# Patient Record
Sex: Male | Born: 1970 | Race: White | Hispanic: No | Marital: Married | Smoking: Never smoker
Health system: Southern US, Community
[De-identification: ages and names within clinical notes are randomized; demographics above are authoritative.]

## PROBLEM LIST (undated history)

## (undated) DIAGNOSIS — K219 Gastro-esophageal reflux disease without esophagitis: Secondary | ICD-10-CM

## (undated) HISTORY — PX: HERNIA REPAIR: SHX51

## (undated) HISTORY — PX: APPENDECTOMY: SHX54

---

## 2004-03-29 ENCOUNTER — Emergency Department: Payer: Self-pay | Admitting: Emergency Medicine

## 2004-07-30 ENCOUNTER — Emergency Department: Payer: Self-pay | Admitting: Emergency Medicine

## 2004-08-02 ENCOUNTER — Emergency Department: Payer: Self-pay | Admitting: Emergency Medicine

## 2004-08-12 ENCOUNTER — Ambulatory Visit: Payer: Self-pay | Admitting: Internal Medicine

## 2005-09-29 ENCOUNTER — Emergency Department: Payer: Self-pay | Admitting: Emergency Medicine

## 2006-03-18 ENCOUNTER — Emergency Department: Payer: Self-pay | Admitting: Emergency Medicine

## 2006-11-04 ENCOUNTER — Emergency Department: Payer: Self-pay | Admitting: Emergency Medicine

## 2007-02-01 ENCOUNTER — Emergency Department: Payer: Self-pay | Admitting: Emergency Medicine

## 2007-02-10 ENCOUNTER — Emergency Department: Payer: Self-pay | Admitting: Emergency Medicine

## 2007-03-26 ENCOUNTER — Ambulatory Visit: Payer: Self-pay | Admitting: Physician Assistant

## 2007-06-16 ENCOUNTER — Other Ambulatory Visit: Payer: Self-pay

## 2007-06-17 ENCOUNTER — Inpatient Hospital Stay: Payer: Self-pay | Admitting: Internal Medicine

## 2007-07-16 ENCOUNTER — Emergency Department: Payer: Self-pay | Admitting: Emergency Medicine

## 2007-07-20 ENCOUNTER — Emergency Department: Payer: Self-pay | Admitting: Emergency Medicine

## 2007-07-22 ENCOUNTER — Encounter: Payer: Self-pay | Admitting: Specialist

## 2007-07-27 ENCOUNTER — Ambulatory Visit: Payer: Self-pay | Admitting: Anesthesiology

## 2007-08-08 ENCOUNTER — Encounter: Payer: Self-pay | Admitting: Specialist

## 2007-09-08 ENCOUNTER — Encounter: Payer: Self-pay | Admitting: Specialist

## 2007-09-21 ENCOUNTER — Ambulatory Visit: Payer: Self-pay | Admitting: Specialist

## 2008-09-25 ENCOUNTER — Emergency Department: Payer: Self-pay | Admitting: Emergency Medicine

## 2009-03-29 ENCOUNTER — Emergency Department (HOSPITAL_COMMUNITY): Admission: EM | Admit: 2009-03-29 | Discharge: 2009-03-29 | Payer: Self-pay | Admitting: Emergency Medicine

## 2010-04-26 ENCOUNTER — Inpatient Hospital Stay: Payer: Self-pay | Admitting: Internal Medicine

## 2010-11-21 ENCOUNTER — Emergency Department: Payer: Self-pay | Admitting: Emergency Medicine

## 2010-11-22 ENCOUNTER — Emergency Department: Payer: Self-pay | Admitting: Emergency Medicine

## 2010-11-26 ENCOUNTER — Emergency Department: Payer: Self-pay | Admitting: Emergency Medicine

## 2010-12-05 ENCOUNTER — Emergency Department (HOSPITAL_COMMUNITY): Payer: BC Managed Care – PPO

## 2010-12-05 ENCOUNTER — Emergency Department (HOSPITAL_COMMUNITY)
Admission: EM | Admit: 2010-12-05 | Discharge: 2010-12-06 | Disposition: A | Discharge status: Home / Self Care | Payer: BC Managed Care – PPO | Attending: Emergency Medicine | Admitting: Emergency Medicine

## 2010-12-05 DIAGNOSIS — M545 Low back pain, unspecified: Secondary | ICD-10-CM | POA: Insufficient documentation

## 2010-12-05 DIAGNOSIS — M542 Cervicalgia: Secondary | ICD-10-CM | POA: Insufficient documentation

## 2010-12-05 DIAGNOSIS — IMO0002 Reserved for concepts with insufficient information to code with codable children: Secondary | ICD-10-CM | POA: Insufficient documentation

## 2010-12-05 DIAGNOSIS — S139XXA Sprain of joints and ligaments of unspecified parts of neck, initial encounter: Secondary | ICD-10-CM | POA: Insufficient documentation

## 2010-12-05 DIAGNOSIS — W108XXA Fall (on) (from) other stairs and steps, initial encounter: Secondary | ICD-10-CM | POA: Insufficient documentation

## 2010-12-05 DIAGNOSIS — R51 Headache: Secondary | ICD-10-CM | POA: Insufficient documentation

## 2010-12-05 LAB — CBC
Hemoglobin: 14.7 g/dL (ref 13.0–17.0)
MCHC: 34.8 g/dL (ref 30.0–36.0)
Platelets: 222 10*3/uL (ref 150–400)

## 2010-12-05 LAB — COMPREHENSIVE METABOLIC PANEL
ALT: 63 U/L — ABNORMAL HIGH (ref 0–53)
AST: 18 U/L (ref 0–37)
Albumin: 3.7 g/dL (ref 3.5–5.2)
Alkaline Phosphatase: 68 U/L (ref 39–117)
BUN: 19 mg/dL (ref 6–23)
CO2: 28 mEq/L (ref 19–32)
Calcium: 7.8 mg/dL — ABNORMAL LOW (ref 8.4–10.5)
Chloride: 103 mEq/L (ref 96–112)
Creatinine, Ser: 0.83 mg/dL (ref 0.50–1.35)
GFR calc Af Amer: >60 mL/min (ref >60)
GFR calc non Af Amer: >60 mL/min (ref >60)
Glucose, Bld: 83 mg/dL (ref 70–99)
Total Protein: 6.1 g/dL (ref 6.0–8.3)

## 2010-12-05 LAB — LACTIC ACID, PLASMA: Lactic Acid, Venous: 0.6 mmol/L (ref 0.5–2.2)

## 2010-12-05 LAB — PROTIME-INR: INR: 1.03 (ref 0.00–1.49)

## 2010-12-05 MED ORDER — IOHEXOL 300 MG/ML  SOLN
100.0000 mL | Freq: Once | INTRAMUSCULAR | Status: AC | PRN
  Administered 2010-12-05: 100 mL via INTRAVENOUS

## 2011-03-10 ENCOUNTER — Emergency Department: Payer: Self-pay

## 2011-07-21 ENCOUNTER — Emergency Department: Payer: Self-pay | Admitting: Emergency Medicine

## 2011-08-28 ENCOUNTER — Emergency Department: Payer: Self-pay | Admitting: *Deleted

## 2011-08-28 LAB — CBC
Platelet: 208 10*3/uL (ref 150–440)
RBC: 4.78 10*6/uL (ref 4.40–5.90)
RDW: 15 % — ABNORMAL HIGH (ref 11.5–14.5)
WBC: 12.8 10*3/uL — ABNORMAL HIGH (ref 3.8–10.6)

## 2011-08-28 LAB — CK TOTAL AND CKMB (NOT AT ARMC)
CK, Total: 54 U/L (ref 35–232)
CK-MB: 0.9 ng/mL (ref 0.5–3.6)

## 2011-08-28 LAB — COMPREHENSIVE METABOLIC PANEL
Anion Gap: 7 (ref 7–16)
BUN: 17 mg/dL (ref 7–18)
Bilirubin,Total: 0.4 mg/dL (ref 0.2–1.0)
Chloride: 106 mmol/L (ref 98–107)
Co2: 27 mmol/L (ref 21–32)
Creatinine: 0.88 mg/dL (ref 0.60–1.30)
EGFR (African American): >60
Osmolality: 281 (ref 275–301)
Potassium: 3.8 mmol/L (ref 3.5–5.1)
Sodium: 140 mmol/L (ref 136–145)
Total Protein: 6.9 g/dL (ref 6.4–8.2)

## 2011-08-28 LAB — TROPONIN I: Troponin-I: <0.02 ng/mL

## 2012-03-04 ENCOUNTER — Encounter (HOSPITAL_COMMUNITY): Payer: Self-pay | Admitting: Emergency Medicine

## 2012-03-04 ENCOUNTER — Emergency Department (HOSPITAL_COMMUNITY)
Admission: EM | Admit: 2012-03-04 | Discharge: 2012-03-04 | Disposition: A | Discharge status: Home / Self Care | Payer: Self-pay | Attending: Emergency Medicine | Admitting: Emergency Medicine

## 2012-03-04 DIAGNOSIS — R200 Anesthesia of skin: Secondary | ICD-10-CM

## 2012-03-04 DIAGNOSIS — M79609 Pain in unspecified limb: Secondary | ICD-10-CM | POA: Insufficient documentation

## 2012-03-04 DIAGNOSIS — M546 Pain in thoracic spine: Secondary | ICD-10-CM

## 2012-03-04 DIAGNOSIS — R209 Unspecified disturbances of skin sensation: Secondary | ICD-10-CM | POA: Insufficient documentation

## 2012-03-04 MED ORDER — PREDNISONE 10 MG PO TABS: 20.0000 mg | ORAL_TABLET | Freq: Two times a day (BID) | ORAL | Status: DC

## 2012-03-04 MED ORDER — OXYCODONE-ACETAMINOPHEN 5-325 MG PO TABS: 1.0000 | ORAL_TABLET | Freq: Four times a day (QID) | ORAL | Status: DC | PRN

## 2012-03-04 NOTE — ED Provider Notes (Signed)
History     CSN: [id number]  Arrival date & time 03/04/12  0848   First MD Initiated Contact with Patient 03/04/12 0901      Chief Complaint  Patient presents with  . Back Pain    (Consider location/radiation/quality/duration/timing/severity/associated sxs/prior treatment) HPI Comments: Patient with a history of chronic low back pain.  Started one month ago with pain in the upper back and now reports numbness in both hands.  He has a pcp that he says he cannot afford to see as he has no insurance.  There are no bowel or bladder complaints.  Patient is a 41 y.o. male presenting with back pain. The history is provided by the patient.  Back Pain  This is a new problem. Episode onset: one month. The problem occurs constantly. The problem has been gradually worsening. The pain is associated with no known injury. The pain is present in the thoracic spine. The quality of the pain is described as shooting. Radiates to: right arm and leg. The pain is severe. The symptoms are aggravated by bending. Pertinent negatives include no fever. He has tried NSAIDs and analgesics for the symptoms. The treatment provided no relief.    History reviewed. No pertinent past medical history.  Past Surgical History  Procedure Date  . Hernia repair     No family history on file.  History  Substance Use Topics  . Smoking status: Never Smoker   . Smokeless tobacco: Not on file  . Alcohol Use: No      Review of Systems  Constitutional: Negative for fever.  Musculoskeletal: Positive for back pain.  All other systems reviewed and are negative.    Allergies  Hydrocodone  Home Medications   Current Outpatient Rx  Name Route Sig Dispense Refill  . ALPRAZOLAM 2 MG PO TABS Oral Take 2 mg by mouth 3 (three) times daily.    . IBUPROFEN 600 MG PO TABS Oral Take 600 mg by mouth every 6 (six) hours as needed. For pain    . OXYCODONE-ACETAMINOPHEN 5-325 MG PO TABS Oral Take 1-2 tablets by mouth every 4  (four) hours as needed. For pain      BP 119/80  Pulse 70  Temp 98.6 F (37 C) (Oral)  Resp 18  SpO2 97%  Physical Exam  Nursing note and vitals reviewed. Constitutional: He is oriented to person, place, and time. He appears well-developed and well-nourished. No distress.  HENT:  Head: Normocephalic and atraumatic.  Neck: Normal range of motion. Neck supple.  Cardiovascular: Normal rate and regular rhythm.   Pulmonary/Chest: Effort normal and breath sounds normal.  Abdominal: Soft. Bowel sounds are normal.  Musculoskeletal:       There is ttp in the soft tissues of the upper thoracic region.    Neurological: He is alert and oriented to person, place, and time.       The dtr's are 1+ and equal in the bue's.  Strength is 5/5 in the bue.    Skin: Skin is warm and dry. He is not diaphoretic.    ED Course  Procedures (including critical care time)  Labs Reviewed - No data to display No results found.   No diagnosis found.    MDM  The patient presents with bilateral arm numbness and upper back pain without injury or trauma.  He is from [place] and has a pcp there who treats him for his chronic back pain.  He took his last percocet today.  His exam is  without neurological deficit and although he wants an mri today, I see no emergent need for this.  He will be treated with prednisone, percocet, and he needs to see his pcp if this meds aren't helping and to determine when imaging is indicated.          Geoffery Lyons, MD 03/04/12 [phone]

## 2012-03-04 NOTE — ED Notes (Signed)
Pt c/o back pain radiating to shoulders and hands, denies trauma, ambulatory to dep, states he took his last oxycodone pta, NAD

## 2012-03-25 ENCOUNTER — Inpatient Hospital Stay: Payer: Self-pay | Admitting: Surgery

## 2012-03-25 LAB — URINALYSIS, COMPLETE
Bacteria: NONE SEEN
Glucose,UR: NEGATIVE mg/dL (ref 0–75)
Ketone: NEGATIVE
Nitrite: NEGATIVE
Specific Gravity: 1.018 (ref 1.003–1.030)
WBC UR: 1 /HPF (ref 0–5)

## 2012-03-25 LAB — CBC
HGB: 15.1 g/dL (ref 13.0–18.0)
MCV: 90 fL (ref 80–100)
Platelet: 231 10*3/uL (ref 150–440)
RBC: 4.92 10*6/uL (ref 4.40–5.90)
RDW: 14.1 % (ref 11.5–14.5)

## 2012-03-25 LAB — BASIC METABOLIC PANEL
Anion Gap: 11 (ref 7–16)
Calcium, Total: 8.9 mg/dL (ref 8.5–10.1)
Chloride: 106 mmol/L (ref 98–107)
Osmolality: 284 (ref 275–301)
Potassium: 4.2 mmol/L (ref 3.5–5.1)

## 2012-04-21 IMAGING — CT CT ABD-PELV W/ CM
4 of 5 series · 13 of 46 positions shown, 19 images · IV contrast (100ml omni 300)
Comparison: None.

CT CHEST

CLINICAL DATA: Fall on back from 6 feet.  Severe low back pain.

CT CHEST, ABDOMEN AND PELVIS WITH CONTRAST
TECHNIQUE: Multidetector CT imaging of the chest, abdomen and
pelvis was performed following the standard protocol during bolus
administration of intravenous contrast.
Contrast: 100 ml Omnipaque 300 IV.

[Series 2: chest/abd/pelvis · axial · 0.89mm/px · z∈[-592,-202]mm · 6 of 134 slices shown]
[im 16/134  soft-tissue]
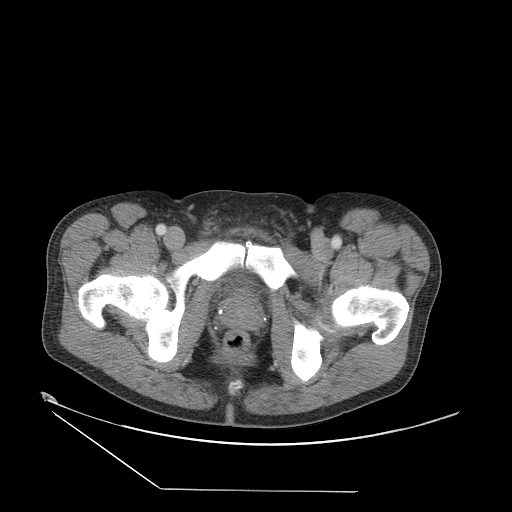
[im 32/134  soft-tissue]
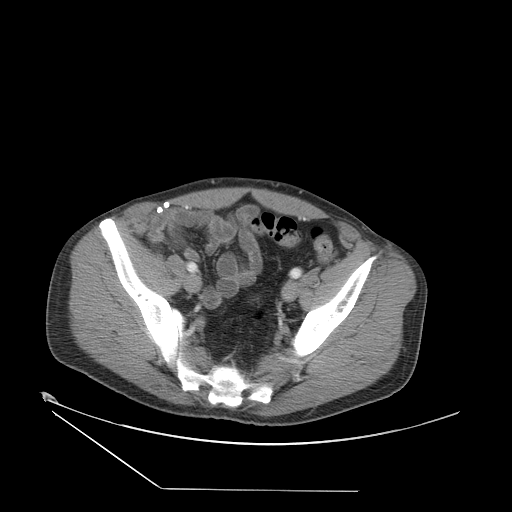
[im 47/134  soft-tissue]
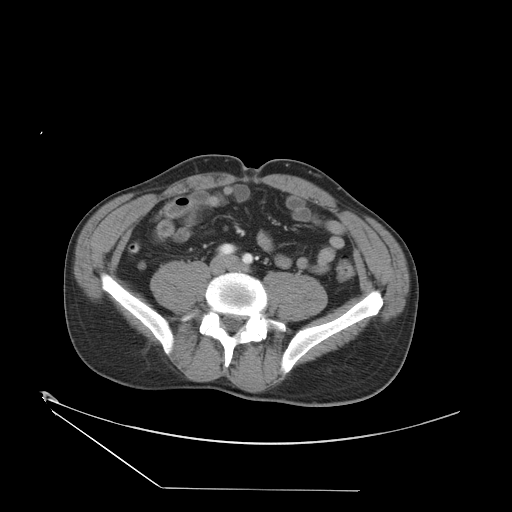
[im 63/134  soft-tissue]
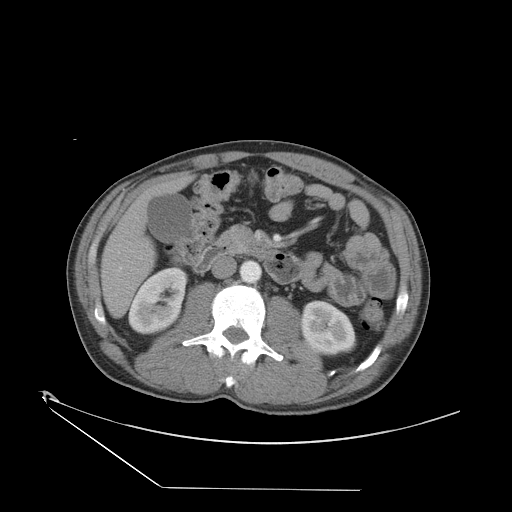
[im 79/134  soft-tissue]
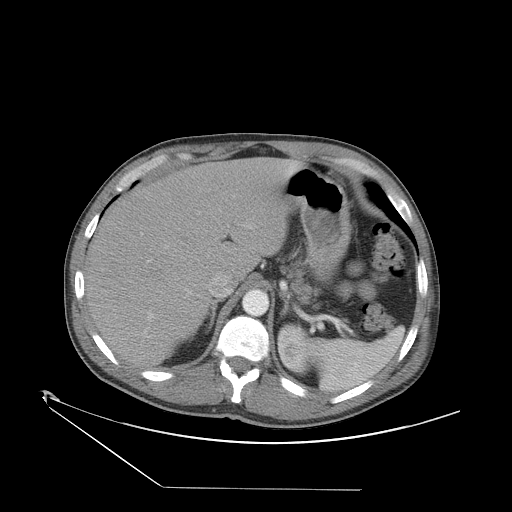
[im 94/134  soft-tissue]
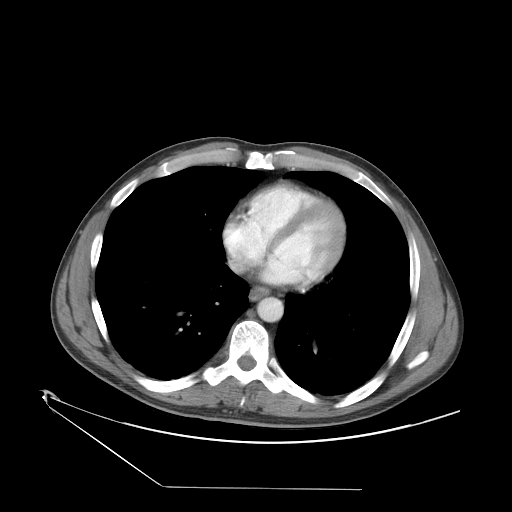

[Series 5: renal delays · axial · 0.92mm/px · z∈[-373,-293]mm · 3 of 34 slices shown, 7 images]
[im 9/34  soft-tissue]
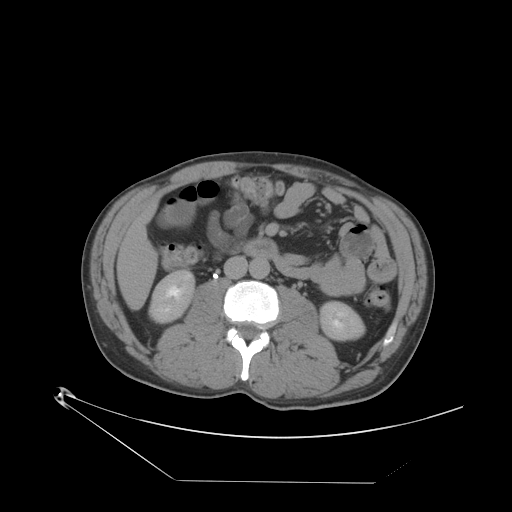
[im 9/34  lung]
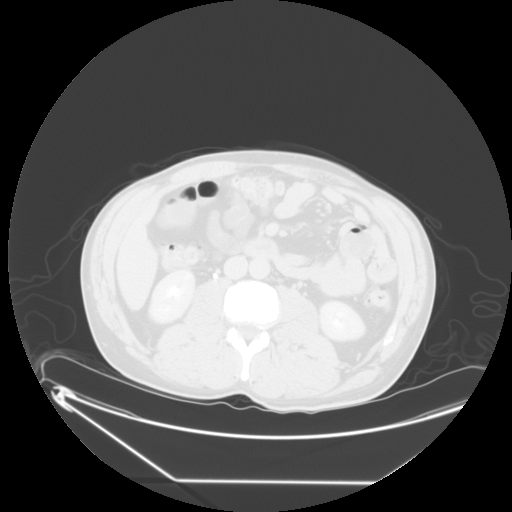
[im 9/34  bone]
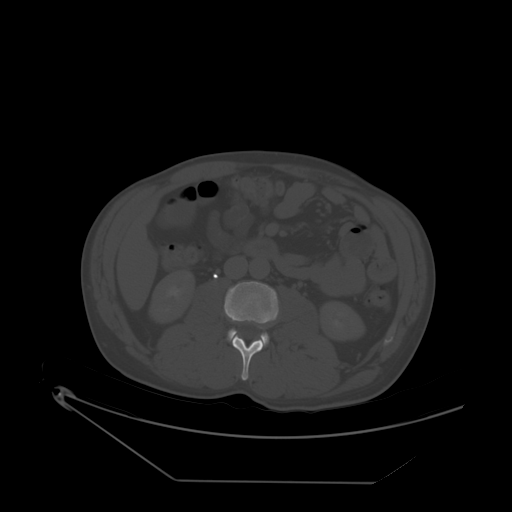
[im 17/34  soft-tissue]
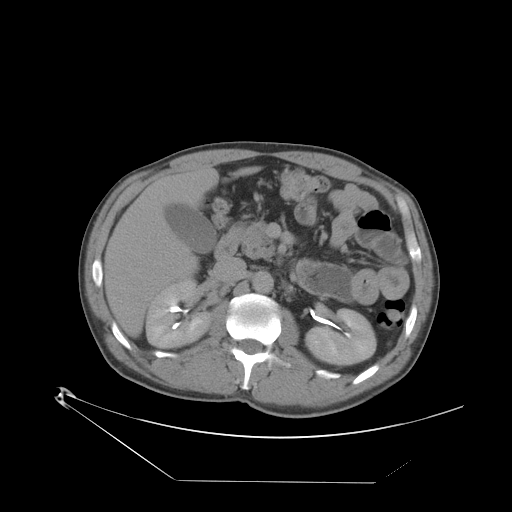
[im 17/34  lung]
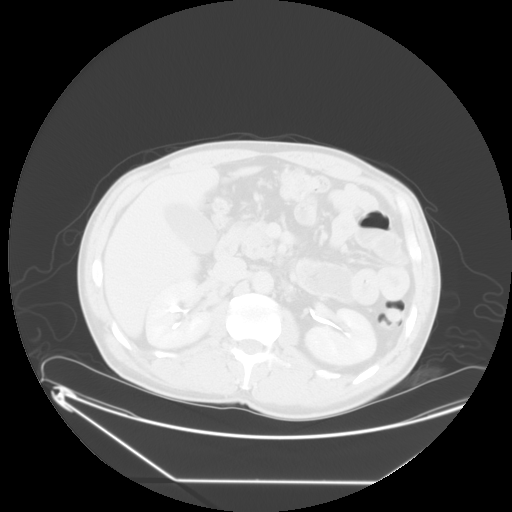
[im 25/34  soft-tissue]
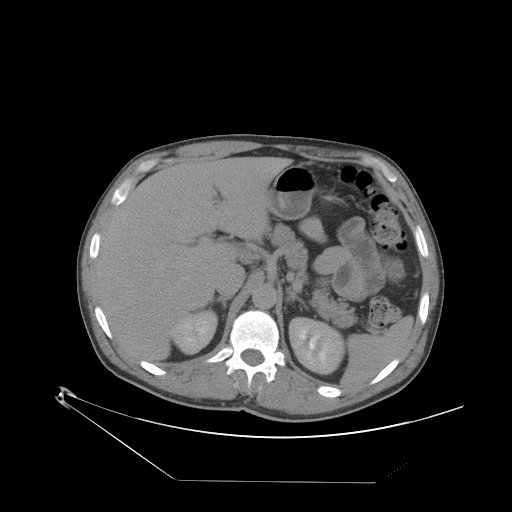
[im 25/34  lung]
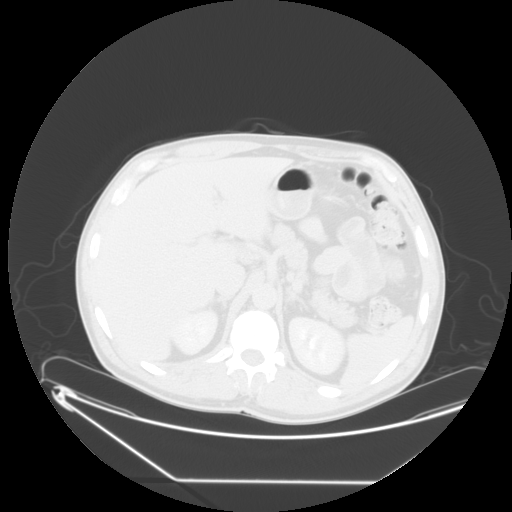

[Series 400: sag · sagittal · 1.35mm/px · 1 of 130 slices shown, 2 images]
[im 44/130  soft-tissue]
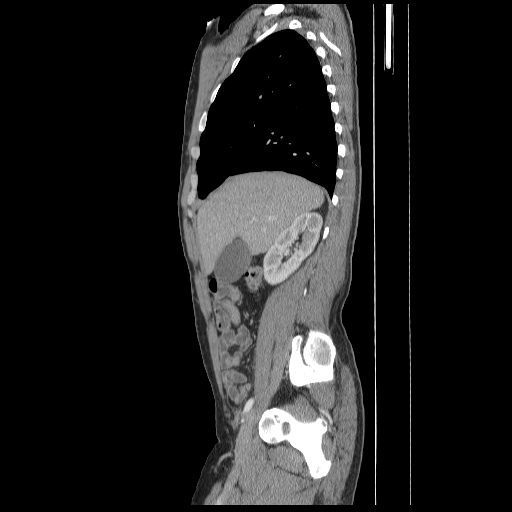
[im 44/130  bone]
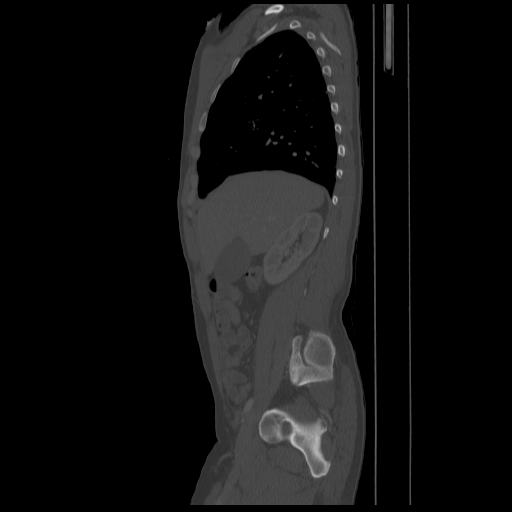

[Series 401: cor · coronal · 1.35mm/px · 3 of 107 slices shown, 4 images]
[im 36/107  soft-tissue]
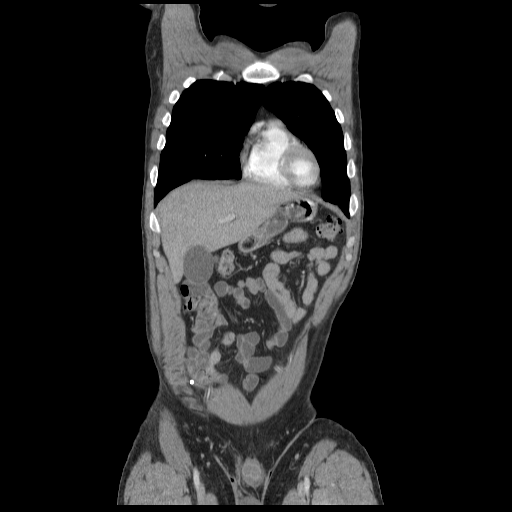
[im 48/107  soft-tissue]
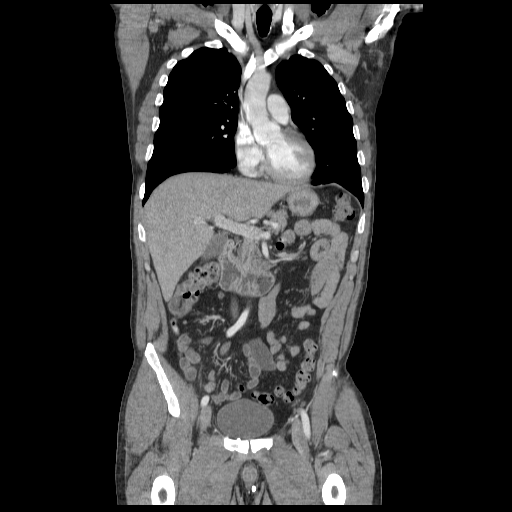
[im 48/107  bone]
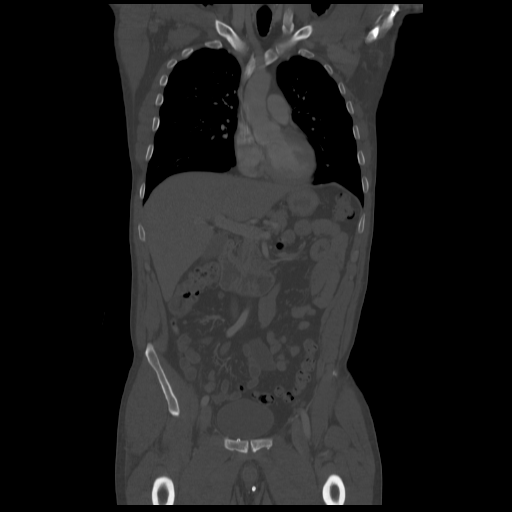
[im 59/107  soft-tissue]
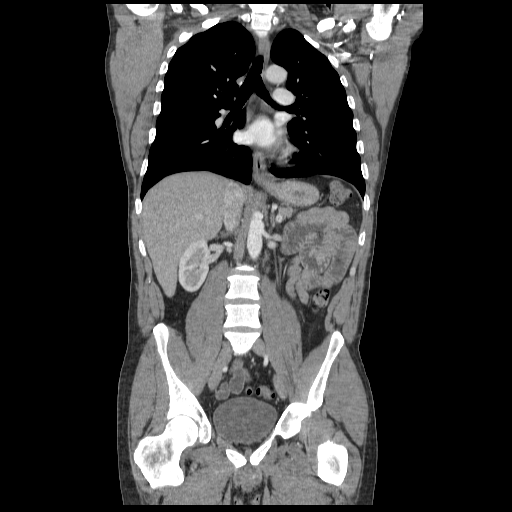

[13 of 46 positions shown; findings below may reference images not displayed]

FINDINGS: Minimal dependent atelectasis in the lungs.  Lungs
otherwise clear.  No pleural effusions or pneumothorax.

Heart is normal size. Aorta is normal caliber. No mediastinal,
hilar, or axillary adenopathy.  Visualized thyroid and chest wall
soft tissues unremarkable.

No acute bony abnormality.
IMPRESSION: No acute findings in the chest.

CT ABDOMEN AND PELVIS
FINDINGS: No acute lumbar spine abnormality.  Normal alignment.
No fracture visualized.  Bony pelvis is intact.

Liver, gallbladder, spleen, pancreas, adrenals and kidneys are
normal. Bowel grossly unremarkable.  No free fluid, free air, or
adenopathy.  Appendix is visualized and is normal.  Urinary bladder
unremarkable.
IMPRESSION: No acute findings in the abdomen or pelvis.

## 2012-06-11 ENCOUNTER — Ambulatory Visit: Payer: Self-pay | Admitting: Surgery

## 2012-06-15 ENCOUNTER — Ambulatory Visit: Payer: Self-pay | Admitting: Surgery

## 2012-06-15 LAB — CBC WITH DIFFERENTIAL/PLATELET
Eosinophil #: 0.1 10*3/uL (ref 0.0–0.7)
HCT: 43.9 % (ref 40.0–52.0)
HGB: 15 g/dL (ref 13.0–18.0)
Lymphocyte %: 31.2 %
MCH: 29.9 pg (ref 26.0–34.0)
MCHC: 34.2 g/dL (ref 32.0–36.0)
MCV: 87 fL (ref 80–100)
Monocyte #: 1 x10 3/mm (ref 0.2–1.0)
Monocyte %: 10.5 %
Neutrophil %: 57.2 %
RBC: 5.03 10*6/uL (ref 4.40–5.90)
RDW: 14.9 % — ABNORMAL HIGH (ref 11.5–14.5)

## 2012-06-15 LAB — COMPREHENSIVE METABOLIC PANEL
Albumin: 4.3 g/dL (ref 3.4–5.0)
Bilirubin,Total: 0.4 mg/dL (ref 0.2–1.0)
Calcium, Total: 8.7 mg/dL (ref 8.5–10.1)
Chloride: 107 mmol/L (ref 98–107)
Co2: 25 mmol/L (ref 21–32)
Creatinine: 0.99 mg/dL (ref 0.60–1.30)
EGFR (African American): >60
EGFR (Non-African Amer.): >60
Osmolality: 280 (ref 275–301)
Potassium: 3.7 mmol/L (ref 3.5–5.1)
SGOT(AST): 28 U/L (ref 15–37)
Total Protein: 7.6 g/dL (ref 6.4–8.2)

## 2012-06-17 ENCOUNTER — Ambulatory Visit: Payer: Self-pay | Admitting: Surgery

## 2012-08-01 ENCOUNTER — Emergency Department: Payer: Self-pay | Admitting: Internal Medicine

## 2012-09-27 ENCOUNTER — Emergency Department: Payer: Self-pay | Admitting: Emergency Medicine

## 2012-09-27 LAB — CBC WITH DIFFERENTIAL/PLATELET
Eosinophil %: 0.1 %
HGB: 14.1 g/dL (ref 13.0–18.0)
Lymphocyte #: 2.3 10*3/uL (ref 1.0–3.6)
MCH: 30.1 pg (ref 26.0–34.0)
MCV: 88 fL (ref 80–100)
Monocyte #: 0.8 x10 3/mm (ref 0.2–1.0)
Neutrophil %: 70.6 %
Platelet: 274 10*3/uL (ref 150–440)
RDW: 14.4 % (ref 11.5–14.5)
WBC: 11 10*3/uL — ABNORMAL HIGH (ref 3.8–10.6)

## 2012-09-27 LAB — COMPREHENSIVE METABOLIC PANEL
Albumin: 3.8 g/dL (ref 3.4–5.0)
Alkaline Phosphatase: 88 U/L (ref 50–136)
BUN: 9 mg/dL (ref 7–18)
Co2: 24 mmol/L (ref 21–32)
Creatinine: 0.86 mg/dL (ref 0.60–1.30)
Glucose: 99 mg/dL (ref 65–99)
Osmolality: 280 (ref 275–301)
Potassium: 3.8 mmol/L (ref 3.5–5.1)
SGPT (ALT): 44 U/L (ref 12–78)
Sodium: 141 mmol/L (ref 136–145)

## 2012-09-28 LAB — TROPONIN I: Troponin-I: <0.02 ng/mL

## 2013-01-18 ENCOUNTER — Emergency Department: Payer: Self-pay | Admitting: Emergency Medicine

## 2013-01-18 LAB — DRUG SCREEN, URINE
Amphetamines, Ur Screen: NEGATIVE (ref ?–1000)
Benzodiazepine, Ur Scrn: POSITIVE (ref ?–200)
Cannabinoid 50 Ng, Ur ~~LOC~~: POSITIVE (ref ?–50)
Cocaine Metabolite,Ur ~~LOC~~: NEGATIVE (ref ?–300)
Tricyclic, Ur Screen: NEGATIVE (ref ?–1000)

## 2013-01-18 LAB — CBC
HGB: 14.3 g/dL (ref 13.0–18.0)
MCH: 30 pg (ref 26.0–34.0)
MCHC: 34.9 g/dL (ref 32.0–36.0)
Platelet: 279 10*3/uL (ref 150–440)
RBC: 4.75 10*6/uL (ref 4.40–5.90)
RDW: 14.7 % — ABNORMAL HIGH (ref 11.5–14.5)

## 2013-01-18 LAB — BASIC METABOLIC PANEL
Anion Gap: 8 (ref 7–16)
Creatinine: 0.86 mg/dL (ref 0.60–1.30)
EGFR (African American): >60
EGFR (Non-African Amer.): >60
Glucose: 95 mg/dL (ref 65–99)
Potassium: 3.9 mmol/L (ref 3.5–5.1)
Sodium: 143 mmol/L (ref 136–145)

## 2013-01-18 LAB — ETHANOL
Ethanol %: 0.062 %
Ethanol: 62 mg/dL

## 2013-01-18 LAB — LIPASE, BLOOD: Lipase: 112 U/L (ref 73–393)

## 2013-01-18 LAB — TROPONIN I
Troponin-I: <0.02 ng/mL
Troponin-I: <0.02 ng/mL

## 2013-01-18 LAB — HEPATIC FUNCTION PANEL A (ARMC)
Alkaline Phosphatase: 96 U/L (ref 50–136)
Bilirubin, Direct: <0.1 mg/dL (ref 0.00–0.20)

## 2013-01-26 ENCOUNTER — Emergency Department: Payer: Self-pay | Admitting: General Practice

## 2013-02-09 ENCOUNTER — Emergency Department: Payer: Self-pay | Admitting: Emergency Medicine

## 2013-02-09 LAB — COMPREHENSIVE METABOLIC PANEL
Albumin: 3.7 g/dL (ref 3.4–5.0)
Alkaline Phosphatase: 115 U/L (ref 50–136)
Anion Gap: 6 — ABNORMAL LOW (ref 7–16)
Bilirubin,Total: 0.2 mg/dL (ref 0.2–1.0)
Calcium, Total: 9.1 mg/dL (ref 8.5–10.1)
Chloride: 106 mmol/L (ref 98–107)
Creatinine: 1.05 mg/dL (ref 0.60–1.30)
EGFR (African American): >60
Glucose: 100 mg/dL — ABNORMAL HIGH (ref 65–99)
Potassium: 3.9 mmol/L (ref 3.5–5.1)
SGOT(AST): 18 U/L (ref 15–37)

## 2013-02-09 LAB — URINALYSIS, COMPLETE
Blood: NEGATIVE
Ketone: NEGATIVE
Leukocyte Esterase: NEGATIVE
Nitrite: NEGATIVE
Ph: 8 (ref 4.5–8.0)
RBC,UR: <1 /HPF (ref 0–5)
Specific Gravity: 1.016 (ref 1.003–1.030)
Squamous Epithelial: <1
WBC UR: 1 /HPF (ref 0–5)

## 2013-02-09 LAB — TSH: Thyroid Stimulating Horm: 2.63 u[IU]/mL

## 2013-02-09 LAB — DRUG SCREEN, URINE
Amphetamines, Ur Screen: NEGATIVE (ref ?–1000)
Barbiturates, Ur Screen: NEGATIVE (ref ?–200)
Benzodiazepine, Ur Scrn: POSITIVE (ref ?–200)
Cannabinoid 50 Ng, Ur ~~LOC~~: POSITIVE (ref ?–50)
Cocaine Metabolite,Ur ~~LOC~~: NEGATIVE (ref ?–300)
MDMA (Ecstasy)Ur Screen: NEGATIVE (ref ?–500)
Methadone, Ur Screen: NEGATIVE (ref ?–300)

## 2013-02-09 LAB — CBC
HCT: 45.2 % (ref 40.0–52.0)
HGB: 15.4 g/dL (ref 13.0–18.0)
MCH: 29.6 pg (ref 26.0–34.0)
Platelet: 282 10*3/uL (ref 150–440)
RDW: 15.2 % — ABNORMAL HIGH (ref 11.5–14.5)

## 2013-06-29 ENCOUNTER — Emergency Department: Payer: Self-pay | Admitting: Emergency Medicine

## 2013-06-29 LAB — URINALYSIS, COMPLETE
Bacteria: NONE SEEN
Bilirubin,UR: NEGATIVE
Blood: NEGATIVE
Glucose,UR: NEGATIVE mg/dL (ref 0–75)
KETONE: NEGATIVE
Leukocyte Esterase: NEGATIVE
NITRITE: NEGATIVE
PROTEIN: NEGATIVE
Ph: 6 (ref 4.5–8.0)
RBC,UR: NONE SEEN /HPF (ref 0–5)
Specific Gravity: 1.039 (ref 1.003–1.030)
Squamous Epithelial: NONE SEEN
WBC UR: 2 /HPF (ref 0–5)

## 2013-06-29 LAB — COMPREHENSIVE METABOLIC PANEL
ALK PHOS: 99 U/L
ALT: 33 U/L (ref 12–78)
ANION GAP: 6 — AB (ref 7–16)
Albumin: 3.8 g/dL (ref 3.4–5.0)
BUN: 14 mg/dL (ref 7–18)
Bilirubin,Total: 0.3 mg/dL (ref 0.2–1.0)
CHLORIDE: 107 mmol/L (ref 98–107)
Calcium, Total: 9.1 mg/dL (ref 8.5–10.1)
Co2: 23 mmol/L (ref 21–32)
Creatinine: 0.81 mg/dL (ref 0.60–1.30)
EGFR (African American): >60
EGFR (Non-African Amer.): >60
Glucose: 108 mg/dL — ABNORMAL HIGH (ref 65–99)
Osmolality: 273 (ref 275–301)
Potassium: 3.9 mmol/L (ref 3.5–5.1)
SGOT(AST): 22 U/L (ref 15–37)
SODIUM: 136 mmol/L (ref 136–145)
Total Protein: 7 g/dL (ref 6.4–8.2)

## 2013-06-29 LAB — CBC
HCT: 44.2 % (ref 40.0–52.0)
HGB: 14.7 g/dL (ref 13.0–18.0)
MCH: 28.4 pg (ref 26.0–34.0)
MCHC: 33.2 g/dL (ref 32.0–36.0)
MCV: 86 fL (ref 80–100)
Platelet: 251 10*3/uL (ref 150–440)
RBC: 5.16 10*6/uL (ref 4.40–5.90)
RDW: 15.5 % — ABNORMAL HIGH (ref 11.5–14.5)
WBC: 10 10*3/uL (ref 3.8–10.6)

## 2014-07-19 ENCOUNTER — Emergency Department: Payer: Self-pay | Admitting: Emergency Medicine

## 2014-09-26 NOTE — Op Note (Signed)
PATIENT NAME:  Victor Logan, Victor Logan DATE OF BIRTH:  Nov 04, 1970  DATE OF PROCEDURE:  03/25/2012  PREOPERATIVE DIAGNOSIS: Acute appendicitis.   POSTOPERATIVE DIAGNOSIS: Acute appendicitis.   PROCEDURE PERFORMED: Laparoscopic appendectomy.   SURGEON: Ida Roguehristopher Daisia Slomski, MD  ESTIMATED BLOOD LOSS: 20 mL.   COMPLICATIONS: None.   INDICATION FOR SURGERY: Victor Logan is a pleasant 44 year old male with one day of right lower quadrant pain. He had a CT scan concerning for acute appendicitis. We brought him to the operating room for laparoscopic appendectomy.   DETAILS OF PROCEDURE: Victor Logan was brought to the operating room suite. He was laid supine on the operating room table. He was induced, endotracheal tube was placed, and general anesthesia was administered. His abdomen was then prepped and draped in standard surgical fashion. A time out was then performed correctly identifying the patient's name, operative site, and procedure to be performed. He had an umbilical hernia which was previously repaired and I was concerned that with his previous, what he said was a mesh repair, that I would injure adherent bowel so therefore I made a midline incision inferior to the umbilicus. I deepened through to the fascia. The fascia was grasped. The fascia was incised. The peritoneum was entered. Two 0 Vicryl stay sutures were placed and a Hassan trocar was placed in the abdomen. The abdomen was insufflated. The appendix was not immediately visualized. Two 5 mm trocars, one in the left lower quadrant and one in the suprapubic region, were then used. The appendix was grasped. It was a bit more cephalad in the abdomen than McBurney's point, corresponding to his point of maximal tenderness. It was noted to be adherent to the adjacent mesentery. I made a hole into the mesoappendix at the base of the appendix. I then placed an Echelon endoscopic stapler with a blue load across the base of the appendix and then  ligated the base of the appendix flush with the cecum. I then used a combination of hook cautery and staple loads to take the long mesoappendix. Of note, I did tear the appendix in half in my attempts to ligate the mesoappendix and it came out in two pieces. After the appendix was taken out through an Endo Catch bag, I then examined the staple lines. There was no obvious bleeding. I then irrigated the abdomen and then all the trocars were taken out under direct visualization. I then closed the infraumbilical fascia with an 0 Vicryl figure-of-eight suture and then the skin was closed on all incisions with a 4-0 Monocryl inverted deep dermal suture. Steri-Strips, Telfa, gauze, and Tegaderm were then used to complete the dressing. He was then awoken, extubated, and taken to the postanesthesia care unit. There were no immediate complications. Needle, sponge, and instrument counts were correct at the end of the procedure.  ____________________________ Si Raiderhristopher A. Keeleigh Terris, MD cal:slb D: 03/25/2012 16:34:01 ET T: 03/25/2012 16:56:48 ET JOB#: [id number]  cc: Cristal Deerhristopher A. Angelis Gates, MD, <Dictator> Jarvis NewcomerHRISTOPHER A Tajuan Dufault MD ELECTRONICALLY SIGNED 03/28/2012 10:10

## 2014-09-26 NOTE — H&P (Signed)
  Subjective/Chief Complaint RLQ pain, nausea, fever to 101    History of Present Illness Victor Logan is a pleasant 44 yo M with a history of hernia repair and peptic ulcer disease who presents with 1 day of RLQ pain.  He says that he actually had bilateral back pain initially radiating to his flank but now has new onset RLQ pain.  Since this pain began he has had nausea and vomiting.  Last meal yesterday.  Had 1 temp to 101.  Pain getting worse.  Has not migrated.  Having regular BM.  Back pain has improved.  Has never felt this pain before.    Past History Peptic ulcer disease Hernia repair - 2002 [place] Depression Back pain   Past Med/Surgical Hx:  Depression:   Herniated Disc:   Degenerative Disc Disease:   gerd:   back injury:   Chronic back pain:   Hernia Repair:   ALLERGIES:  Hydrocodone: N/V/Diarrhea  HOME MEDICATIONS: Medication Instructions Status  acetaminophen-oxyCODONE 325 mg-5 mg tablet 1 tab(s) orally every 6 hours, As Needed- for Pain  Active  alprazolam 2 mg oral tablet 1 tab(s) orally 3 to 4 times a day, As Needed for anxiety Active  cyclobenzaprine 10 mg oral tablet 1 tab(s) orally 3 to 4 times a day, As Needed for spasms Active     Medications Ibuprofen prn back pain   Family and Social History:   Family History Coronary Artery Disease  Diabetes Mellitus    Social History negative tobacco, positive ETOH, positive Illicit drugs, MJ use 1 week ago    Place of Living Home   Review of Systems:   Subjective/Chief Complaint RLQ pain, nausea/vomiting, fever    Fever/Chills Yes    Cough No    Sputum No    Abdominal Pain Yes    Diarrhea No    Constipation No    Nausea/Vomiting Yes    SOB/DOE No    Chest Pain No    Telemetry Reviewed NSR    Dysuria No    Tolerating PT Yes  No    Tolerating Diet No    Medications/Allergies Reviewed Medications/Allergies reviewed   Physical Exam:   GEN well developed, no acute distress    HEENT pink  conjunctivae, PERRL    NECK supple    RESP normal resp effort  clear BS  no use of accessory muscles    CARD regular rate  irregular rate  no thrills    ABD positive tenderness  denies Flank Tenderness  no liver/spleen enlargement  positive hernia  soft  no Adominal Mass    LYMPH negative neck    EXTR negative cyanosis/clubbing, negative edema    SKIN No rashes, No ulcers    NEURO cranial nerves deficit, negative rigidity, negative tremor, follows commands    PSYCH A+O to time, place, person, good insight   Lab Results: Routine Chem:  17-Oct-13 09:15    Glucose, Serum  108   BUN 14   Creatinine (comp) 0.80   Sodium, Serum 142   Potassium, Serum 4.2   Chloride, Serum 106   CO2, Serum 25   Calcium (Total), Serum 8.9   Anion Gap 11   Osmolality (calc) 284   eGFR (African American) >60   eGFR (Non-African American) >60 (eGFR values <60mL/min/1.73 m2 may be an indication of chronic kidney disease (CKD). Calculated eGFR is useful in patients with stable renal function. The eGFR calculation will not be reliable in acutely ill patients when serum   creatinine is changing rapidly. It is not useful in  patients on dialysis. The eGFR calculation may not be applicable to patients at the low and high extremes of body sizes, pregnant women, and vegetarians.)  Routine UA:  17-Oct-13 09:15    Color (UA) Yellow   Clarity (UA) Clear   Glucose (UA) Negative   Bilirubin (UA) Negative   Ketones (UA) Negative   Specific Gravity (UA) 1.018   Blood (UA) Negative   pH (UA) 5.0   Protein (UA) Negative   Nitrite (UA) Negative   Leukocyte Esterase (UA) Negative (Result(s) reported on 25 Mar 2012 at 11:26AM.)   RBC (UA) <1 /HPF   WBC (UA) 1 /HPF   Bacteria (UA) NONE SEEN   Epithelial Cells (UA) NONE SEEN   Mucous (UA) PRESENT (Result(s) reported on 25 Mar 2012 at 11:26AM.)  Routine Hem:  17-Oct-13 09:15    WBC (CBC) 8.3   RBC (CBC) 4.92   Hemoglobin (CBC) 15.1   Hematocrit (CBC)  44.2   Platelet Count (CBC) 231 (Result(s) reported on 25 Mar 2012 at 09:29AM.)   MCV 90   MCH 30.6   MCHC 34.1   RDW 14.1     Assessment/Admission Diagnosis 44 yo M with history of RLQ pain, nausea/vomiting, fevers.  Nl wbc but appendix does appear thickened with adjacent stranding on CT scan.    Plan Plan for OR for lap appendectomy   Electronic Signatures: ,  A (MD)  (Signed 17-Oct-13 12:50)  Authored: CHIEF COMPLAINT and HISTORY, PAST MEDICAL/SURGIAL HISTORY, ALLERGIES, HOME MEDICATIONS, OTHER MEDICATIONS, FAMILY AND SOCIAL HISTORY, REVIEW OF SYSTEMS, PHYSICAL EXAM, LABS, ASSESSMENT AND PLAN   Last Updated: 17-Oct-13 12:50 by ,  A (MD) 

## 2014-09-29 NOTE — Consult Note (Signed)
PATIENT NAME:  Victor Logan, Victor Logan MR#:  [id number] DATE OF BIRTH:  02-09-1971  DATE OF CONSULTATION:  02/10/2013  REFERRING PHYSICIAN:  Lowella FairyJohn Woodruff, MD CONSULTING PHYSICIAN:  Ardeen FillersUzma S. Garnetta BuddyFaheem, MD  REASON FOR CONSULTATION:  "I need help".   HISTORY OF PRESENT ILLNESS:  The patient is a 44 year old married Caucasian male who presented to the ED accompanied by his wife as he was having suicidal thoughts in the context of using drugs. The patient reported that he has been having depression for a long period of time and finally he decided to seek help. He reported that he was feeling very emotional and does not want to be around anyone at this time. He reported that he has been struggling with drug use as well as binge drinking off and on for the past two years. He reported that his drug of choice is cocaine and he spends approximately $300 per month. He reported that he also drinks alcohol to the point that he blacks out. The patient reported that he consumes beer as well as some hard liquor. He used the last time over the weekend. The patient reported that he has a history of withdrawal symptoms including feeling sick, shaky as well as sweating. The patient reported that he was controlling the withdrawal symptoms with the help of the Xanax, 2 mg 4 times a day, which are prescribed by his primary care physician, Dr. Lacie ScottsNiemeyer. He ran out of the Xanax over the weekend. The patient reported that he is also prescribed pain medications including tramadol and Flexeril by Dr. Lacie ScottsNiemeyer due to a history of back pain. The patient stated that he wants help, as he does not know what is the main reason that he is having these symptoms, and he has been using drugs to control his anxiety, depression, and has been self-medicating himself. He stated that he occasionally hears voices calling him, but they are not clear and coherent. However, he is not having any commanding auditory hallucinations. He currently denied having any  suicidal ideations or plans and is interested in going to the rehab program.   PAST PSYCHIATRIC HISTORY:  The patient reported that he does not have any history of psychiatric admission in the past. He has never attempted suicide but has thought about it multiple times in the past. He reported that he is currently fighting intoxication and has been having rage from anxiety. He also is concerned about the voices, which comes and goes, but they are not clear cut. He reported that the current medications do not help him.   CURRENT MEDICATIONS:  Citalopram 20 mg daily, tramadol 50 mg every 4 to 6 hours p.r.n., Flexeril for back pain.   He reported that he was admitted for the detox when he was 44 years old for only three days. He has never been to a rehab program.   FAMILY HISTORY:  He reported that his father's side of the family has history of drug use. The patient denied any history of suicide in his family.   PAST MEDICAL HISTORY:  Chronic back pain, GERD.   ALLERGIES:  HYDROCODONE.   SOCIAL HISTORY:  The patient reported that he is currently married and lives with his wife. They are currently undergoing separation. He does not have any children. He has two DWIs, which are currently pending, but he reported that his probation officer is going help him if he will go to the rehab programs.   ANCILLARY DATA:  Temperature 98.5, pulse 78, respirations  20, blood pressure 118/74.   LABS:  Glucose 100, BUN 9, creatinine 1.05, sodium 139, potassium 3.9, chloride 106, bicarbonate 27, anion gap 6. Blood alcohol level less than 3, bilirubin 0.2, AST 18, ALT 24. UDS positive for benzodiazepines, cannabinoids and tricyclic antidepressants. WBC 9.7, hemoglobin 15.4, MCV 87.  REVIEW OF SYSTEMS:  GENERAL:  Denies having any weight loss.  EYES:  No double or blurred vision.  GASTROINTESTINAL:  No abdominal pain, nausea, vomiting, diarrhea.  GENITOURINARY:  No incontinence.  MUSCULOSKELETAL:  Denies any muscle  or joint pain.  NEUROLOGIC:  No tingling or weakness.   MENTAL STATUS EXAMINATION:  The patient is a moderately built male who was lying in the bed. He appeared calm and collective. His mood was anxious. Affect was congruent. Thought process was logical and goal-directed. Thought content was nondelusional. He appeared somewhat depressed. His affect was blunted. Thought process was logical and goal-directed. Thought content was nondelusional. He thought about coming into the rehab program and when he was giving information, he said he will think about it. He demonstrated poor insight and judgment regarding his drug use.     DIAGNOSTIC IMPRESSION:  AXIS I:  1.  Mood disorder, not otherwise specified.                2.  Alcohol dependence.                 3.  Cocaine dependence.  AXIS II:  None.  AXIS III:  Please review the medical history.   TREATMENT PLAN:  I discussed the patient at length about the different options available for rehab including [facility] and a bed has been available for tomorrow, but he reported that he will think about it. He reported that he wants to continue his medications including Xanax, citalopram at this time. I advised the patient that he might not get the same medications and he will go to the rehab facility and he demonstrated understanding. I will start him on Librium 25 mg p.o. q.6 hours to prevent him from going into withdrawal from the Xanax. He will be provided information about the rehabilitation facility, and the patient will be monitored closely for any withdrawal symptoms. Thank you for allowing me to participate in the care of this patient.   ____________________________ Ardeen Fillers. Garnetta Buddy, MD usf:jm D: 02/10/2013 14:14:54 ET Logan: 02/10/2013 14:27:29 ET JOB#: [id number]  cc: Ardeen Fillers. Garnetta Buddy, MD, <Dictator> Rhunette Croft MD ELECTRONICALLY SIGNED 02/17/2013 13:23

## 2014-09-29 NOTE — Op Note (Signed)
PATIENT NAME:  Victor Logan, [name] MR#:  [id number] DATE OF BIRTH:  1971-02-27  DATE OF PROCEDURE:  06/17/2012  SURGEON:  Cristal Deerhristopher A. Rik Wadel, MD.  ASSISTANT:  Rudolpho Sevinaroline York, [place] student.  PREOPERATIVE DIAGNOSIS:  Recurrent incarcerated umbilical hernia.   POSTOPERATIVE DIAGNOSIS:  Recurrent incarcerated umbilical hernia.  PROCEDURE PERFORMED: Open umbilical hernia repair with 4 x 4 cm piece Ventralex patch.   ANESTHESIA: General.   ESTIMATED BLOOD LOSS:  10 mL.  SPECIMENS: None.   INDICATIONS FOR SURGERY: Mr. Neil CrouchWinn is a pleasant 44 year old male who presented with recurrent umbilical hernia, after having a previous mesh repair.  He thus was brought to the Operating Room for umbilical hernia repair.   DETAILS OF PROCEDURE: Mr. Neil CrouchWinn was brought to the Operating Room suite. He was induced, endotracheal tube was placed, general anesthesia was administered. The abdomen was then prepped and draped in standard surgical sterile fashion. A supraumbilical incision was made.  The incision was deepened down to the fascia.  The umbilicus was taken off the fascial defect.  There was an approximately 0.8 x 0.8 cm defect. Because this had recurred previously, although I was not able to see mesh or previous sutures. I elected to place a 4 x 4 Ventralex patch. I cleared off the inferior aspect of the defect and then placed the Ventralex patch. I then closed the defect longitudinally with interrupted figure-of-eight 0-Ethibond sutures, incorporating the mesh tails into the bites.  I then irrigated the incision.  The umbilicus was then tacked to the repair using inverted 3-0 Vicryl sutures. The subcutaneous dead space was then closed using 3-0 Vicryl interrupted figure-of-eight and the skin was closed using 4-0 Monocryl running subcuticular. SteriStrips and Telfa gauze and Tegaderm was then placed over the wound. Approximately 15 mL 1% lidocaine mixed with 0.5% Marcaine with epinephrine were used for local  anesthetic.  The patient was then awoken, extubated and brought to the post-anesthesia care unit.  There were no immediate complications. Needle, sponge, and instrument counts were correct at the end of the procedure.   ____________________________ Si Raiderhristopher A. Jathen Sudano, MD cal:eg D: 06/17/2012 15:18:32 ET T: 06/17/2012 19:29:47 ET JOB#: [id number]  cc: Cristal Deerhristopher A. Corretta Munce, MD, <Dictator> Jarvis NewcomerHRISTOPHER A Tasean Mancha MD ELECTRONICALLY SIGNED 06/18/2012 11:23

## 2014-10-03 ENCOUNTER — Emergency Department
Admit: 2014-10-03 | Disposition: A | Discharge status: Home / Self Care | Payer: Self-pay | Admitting: Emergency Medicine

## 2014-10-04 LAB — URINALYSIS, COMPLETE
BACTERIA: NONE SEEN
Bilirubin,UR: NEGATIVE
GLUCOSE, UR: NEGATIVE mg/dL (ref 0–75)
Ketone: NEGATIVE
LEUKOCYTE ESTERASE: NEGATIVE
Nitrite: NEGATIVE
Ph: 5 (ref 4.5–8.0)
Protein: NEGATIVE
Specific Gravity: 1.021 (ref 1.003–1.030)

## 2014-12-06 ENCOUNTER — Encounter: Payer: Self-pay | Admitting: Emergency Medicine

## 2014-12-06 ENCOUNTER — Emergency Department
Admission: EM | Admit: 2014-12-06 | Discharge: 2014-12-06 | Disposition: A | Discharge status: Home / Self Care | Payer: Self-pay | Attending: Emergency Medicine | Admitting: Emergency Medicine

## 2014-12-06 DIAGNOSIS — Z87828 Personal history of other (healed) physical injury and trauma: Secondary | ICD-10-CM | POA: Insufficient documentation

## 2014-12-06 DIAGNOSIS — M25511 Pain in right shoulder: Secondary | ICD-10-CM | POA: Insufficient documentation

## 2014-12-06 DIAGNOSIS — Z7952 Long term (current) use of systemic steroids: Secondary | ICD-10-CM | POA: Insufficient documentation

## 2014-12-06 DIAGNOSIS — Z79899 Other long term (current) drug therapy: Secondary | ICD-10-CM | POA: Insufficient documentation

## 2014-12-06 MED ORDER — OXYCODONE-ACETAMINOPHEN 5-325 MG PO TABS
2.0000 | ORAL_TABLET | Freq: Once | ORAL | Status: AC
  Administered 2014-12-06: 2 via ORAL

## 2014-12-06 MED ORDER — OXYCODONE-ACETAMINOPHEN 5-325 MG PO TABS
ORAL_TABLET | ORAL | Status: AC
Start: 2014-12-06 — End: 2014-12-06
  Filled 2014-12-06: qty 2

## 2014-12-06 MED ORDER — DEXAMETHASONE SODIUM PHOSPHATE 10 MG/ML IJ SOLN
INTRAMUSCULAR | Status: AC
  Filled 2014-12-06: qty 1

## 2014-12-06 MED ORDER — OXYCODONE-ACETAMINOPHEN 5-325 MG PO TABS: 1.0000 | ORAL_TABLET | Freq: Three times a day (TID) | ORAL | Status: DC | PRN

## 2014-12-06 MED ORDER — DEXAMETHASONE SODIUM PHOSPHATE 10 MG/ML IJ SOLN
10.0000 mg | Freq: Once | INTRAMUSCULAR | Status: AC
  Administered 2014-12-06: 10 mg via INTRAMUSCULAR
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
## 2014-12-06 NOTE — ED Provider Notes (Signed)
[facility] Emergency Department Provider Note  ____________________________________________  Time seen: [id number]  I have reviewed the triage vital signs and the nursing notes.   HISTORY  Chief Complaint Shoulder Pain    HPI Ihor DowWilliam T Sandora is a 44 y.o. male complaining of right shoulder pain from physical therapy has a torn labrum and some other issues for which she is having surgery on July 13 using physical therapy yesterday and strained something in her shoulder since become inflamed and incredibly painful for the patient rates it as approximately a 10 out of 10 burning pain which nothing seems to make him better or worse and is here today for further evaluation and treatment   No past medical history on file.  There are no active problems to display for this patient.   Past Surgical History  Procedure Laterality Date  . Hernia repair      Current Outpatient Rx  Name  Route  Sig  Dispense  Refill  . alprazolam (XANAX) 2 MG tablet   Oral   Take 2 mg by mouth 3 (three) times daily.         Marland Kitchen. ibuprofen (ADVIL,MOTRIN) 600 MG tablet   Oral   Take 600 mg by mouth every 6 (six) hours as needed. For pain         . oxyCODONE-acetaminophen (PERCOCET/ROXICET) 5-325 MG per tablet   Oral   Take 1-2 tablets by mouth every 4 (four) hours as needed. For pain         . oxyCODONE-acetaminophen (PERCOCET/ROXICET) 5-325 MG per tablet   Oral   Take 1-2 tablets by mouth every 6 (six) hours as needed for pain.   20 tablet   0   . oxyCODONE-acetaminophen (ROXICET) 5-325 MG per tablet   Oral   Take 1 tablet by mouth every 8 (eight) hours as needed for moderate pain or severe pain.   15 tablet   0   . predniSONE (DELTASONE) 10 MG tablet   Oral   Take 2 tablets (20 mg total) by mouth 2 (two) times daily.   20 tablet   0     Allergies Hydrocodone  History reviewed. No pertinent family history.  Social History History  Substance Use Topics  .  Smoking status: Never Smoker   . Smokeless tobacco: Not on file  . Alcohol Use: No    Review of Systems Constitutional: No fever/chills Eyes: No visual changes. ENT: No sore throat. Cardiovascular: Denies chest pain. Respiratory: Denies shortness of breath. Gastrointestinal: No abdominal pain.  No nausea, no vomiting.  No diarrhea.  No constipation. Genitourinary: Negative for dysuria. Musculoskeletal: Negative for back pain. Skin: Negative for rash. Neurological: Negative for headaches, focal weakness or numbness. 10-point ROS otherwise negative.  ____________________________________________   PHYSICAL EXAM:  VITAL SIGNS: ED Triage Vitals  Enc Vitals Group     BP 12/06/14 0727 150/97 mmHg     Pulse Rate 12/06/14 0727 76     Resp 12/06/14 0727 18     Temp 12/06/14 0727 98.3 F (36.8 C)     Temp Source 12/06/14 0727 Oral     SpO2 12/06/14 0727 96 %     Weight --      Height --      Head Cir --      Peak Flow --      Pain Score 12/06/14 0729 10     Pain Loc --      Pain Edu? --  Excl. in GC? --     Constitutional: Alert and oriented. Well appearing and in no acute distress. Eyes: Conjunctivae are normal. PERRL. EOMI. Head: Atraumatic. Nose: No congestion/rhinnorhea. Mouth/Throat: Mucous membranes are moist.  Oropharynx non-erythematous. Neck: No stridor.   Cardiovascular: Normal rate, regular rhythm. Grossly normal heart sounds.  Good peripheral circulation. Respiratory: Normal respiratory effort.  No retractions. Lungs CTAB. Musculoskeletal: No lower extremity tenderness nor edema.  Pain with palpation to his right shoulder particularly at the ac joint pain with abduction of the arm Neurologic:  Normal speech and language. No gross focal neurologic deficits are appreciated. Speech is normal. No gait instability. Skin:  Skin is warm, dry and intact. No rash noted. Psychiatric: Mood and affect are normal. Speech and behavior are  normal.  ____________________________________________  PROCEDURES  Procedure(s) performed: None  Critical Care performed: No  ____________________________________________   INITIAL IMPRESSION / ASSESSMENT AND PLAN / ED COURSE  Pertinent labs & imaging results that were available during my care of the patient were reviewed by me and considered in my medical decision making (see chart for details).  Initial impression on this patient is right shoulder strain given his history or go ahead and give him some medicine for pain management follow-up with orthopedics as soon as possible return if any acute concerns or worsening symptoms given shot of Decadron and Percocet in the department ____________________________________________   FINAL CLINICAL IMPRESSION(S) / ED DIAGNOSES  Final diagnoses:  Shoulder pain, acute, right     Buzz Axel WAINO MOUNSEY, PA-C 12/06/14 5284  Sharman Cheek, MD 12/06/14 [phone]

## 2014-12-08 HISTORY — PX: SHOULDER SURGERY: SHX246

## 2014-12-14 ENCOUNTER — Encounter: Payer: Self-pay | Admitting: Emergency Medicine

## 2014-12-14 ENCOUNTER — Emergency Department
Admission: EM | Admit: 2014-12-14 | Discharge: 2014-12-14 | Disposition: A | Discharge status: Home / Self Care | Payer: Self-pay | Attending: Emergency Medicine | Admitting: Emergency Medicine

## 2014-12-14 DIAGNOSIS — Y9289 Other specified places as the place of occurrence of the external cause: Secondary | ICD-10-CM | POA: Insufficient documentation

## 2014-12-14 DIAGNOSIS — X19XXXA Contact with other heat and hot substances, initial encounter: Secondary | ICD-10-CM | POA: Insufficient documentation

## 2014-12-14 DIAGNOSIS — T22231A Burn of second degree of right upper arm, initial encounter: Secondary | ICD-10-CM | POA: Insufficient documentation

## 2014-12-14 DIAGNOSIS — M25511 Pain in right shoulder: Secondary | ICD-10-CM

## 2014-12-14 DIAGNOSIS — Y998 Other external cause status: Secondary | ICD-10-CM | POA: Insufficient documentation

## 2014-12-14 DIAGNOSIS — T22211A Burn of second degree of right forearm, initial encounter: Secondary | ICD-10-CM | POA: Insufficient documentation

## 2014-12-14 DIAGNOSIS — Y9389 Activity, other specified: Secondary | ICD-10-CM | POA: Insufficient documentation

## 2014-12-14 DIAGNOSIS — T2220XA Burn of second degree of shoulder and upper limb, except wrist and hand, unspecified site, initial encounter: Secondary | ICD-10-CM

## 2014-12-14 DIAGNOSIS — R52 Pain, unspecified: Secondary | ICD-10-CM

## 2014-12-14 DIAGNOSIS — S41001A Unspecified open wound of right shoulder, initial encounter: Secondary | ICD-10-CM | POA: Insufficient documentation

## 2014-12-14 MED ORDER — OXYCODONE-ACETAMINOPHEN 5-325 MG PO TABS: 1.0000 | ORAL_TABLET | ORAL | Status: DC | PRN

## 2014-12-14 MED ORDER — DICLOFENAC SODIUM 75 MG PO TBEC: 75.0000 mg | DELAYED_RELEASE_TABLET | Freq: Two times a day (BID) | ORAL | Status: DC
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
## 2014-12-14 NOTE — ED Notes (Signed)
States he has a known tear in right shoulder . States pain became increased after PT on tuesday

## 2014-12-14 NOTE — Discharge Instructions (Signed)
CALL YOUR [facility] FOR FURTHER INSTRUCTIONS ABOUT YOUR PAIN CONTROL OR SEE YOUR PRIMARY CARE DOCTOR A LIMITED AMOUNT WAS WRITTEN  FOR YOU TODAY

## 2015-02-01 ENCOUNTER — Emergency Department
Admission: EM | Admit: 2015-02-01 | Discharge: 2015-02-01 | Disposition: A | Discharge status: Home / Self Care | Payer: Self-pay | Attending: Emergency Medicine | Admitting: Emergency Medicine

## 2015-02-01 ENCOUNTER — Encounter: Payer: Self-pay | Admitting: Emergency Medicine

## 2015-02-01 DIAGNOSIS — Z9889 Other specified postprocedural states: Secondary | ICD-10-CM | POA: Insufficient documentation

## 2015-02-01 DIAGNOSIS — G8918 Other acute postprocedural pain: Secondary | ICD-10-CM | POA: Insufficient documentation

## 2015-02-01 DIAGNOSIS — Z791 Long term (current) use of non-steroidal anti-inflammatories (NSAID): Secondary | ICD-10-CM | POA: Insufficient documentation

## 2015-02-01 DIAGNOSIS — Z79899 Other long term (current) drug therapy: Secondary | ICD-10-CM | POA: Insufficient documentation

## 2015-02-01 DIAGNOSIS — M25511 Pain in right shoulder: Secondary | ICD-10-CM | POA: Insufficient documentation

## 2015-02-01 MED ORDER — HYDROMORPHONE HCL 1 MG/ML IJ SOLN
1.0000 mg | Freq: Once | INTRAMUSCULAR | Status: AC
  Administered 2015-02-01: 1 mg via INTRAMUSCULAR
  Filled 2015-02-01: qty 1

## 2015-02-01 NOTE — ED Notes (Signed)
Had surgery at [facility] for bicep and rotator cuff about 6 weeks ago.  He is going to physical therapy, and his pain has increased over past few days since his last PT and he had diff sleeping last night.

## 2015-02-01 NOTE — ED Provider Notes (Signed)
[facility] Emergency Department Provider Note  ____________________________________________  Time seen: Approximately 9:55 AM  I have reviewed the triage vital signs and the nursing notes.   HISTORY  Chief Complaint Shoulder Pain   HPI Victor Logan is a 44 y.o. male patient is here with complaint of right shoulder pain. Patient had rotator cuff biceps surgery [place] 6 weeks ago at [facility]. He is now going through physical therapy and has increased pain for the last several days. He was unable to sleep last night. He is going to see his surgeon at [facility] at 2:00 today but is unable to tolerate the pain. His last Percocet was approximately 7:00 today. Currently his pain is 9 out of 10.   History reviewed. No pertinent past medical history.  There are no active problems to display for this patient.   Past Surgical History  Procedure Laterality Date  . Hernia repair    . Shoulder surgery Right 12/2014    Current Outpatient Rx  Name  Route  Sig  Dispense  Refill  . alprazolam (XANAX) 2 MG tablet   Oral   Take 2 mg by mouth 3 (three) times daily.         . diclofenac (VOLTAREN) 75 MG EC tablet   Oral   Take 1 tablet (75 mg total) by mouth 2 (two) times daily.   30 tablet   0   . ibuprofen (ADVIL,MOTRIN) 600 MG tablet   Oral   Take 600 mg by mouth every 6 (six) hours as needed. For pain         . oxyCODONE-acetaminophen (PERCOCET) 5-325 MG per tablet   Oral   Take 1 tablet by mouth every 4 (four) hours as needed for severe pain.   20 tablet   0   . pregabalin (LYRICA) 150 MG capsule   Oral   Take 150 mg by mouth 2 (two) times daily.           Allergies Hydrocodone  No family history on file.  Social History Social History  Substance Use Topics  . Smoking status: Never Smoker   . Smokeless tobacco: None  . Alcohol Use: Yes     Comment: occassional    Review of Systems Constitutional: No fever/chills Cardiovascular: Denies  chest pain. Respiratory: Denies shortness of breath. Gastrointestinal: No abdominal pain.  No nausea, no vomiting. Genitourinary: Negative for dysuria. Musculoskeletal: Negative for back pain. Skin: Negative for rash. Neurological: Negative for headaches, focal weakness or numbness.  10-point ROS otherwise negative.  ____________________________________________   PHYSICAL EXAM:  VITAL SIGNS: ED Triage Vitals  Enc Vitals Group     BP 02/01/15 0903 135/91 mmHg     Pulse Rate 02/01/15 0903 84     Resp 02/01/15 0903 16     Temp 02/01/15 0903 98.7 F (37.1 C)     Temp Source 02/01/15 0903 Oral     SpO2 02/01/15 0903 96 %     Weight 02/01/15 0903 230 lb (104.327 kg)     Height 02/01/15 0903  (1.778 m)     Head Cir --      Peak Flow --      Pain Score 02/01/15 0904 9     Pain Loc --      Pain Edu? --      Excl. in GC? --     Constitutional: Alert and oriented. Well appearing and in no acute distress. Eyes: Conjunctivae are normal. PERRL. EOMI. Head: Atraumatic.  Nose: No congestion/rhinnorhea. Neck: No stridor.   Cardiovascular: Normal rate, regular rhythm. Grossly normal heart sounds.  Good peripheral circulation. Respiratory: Normal respiratory effort.  No retractions. Lungs CTAB. Gastrointestinal: Soft and nontender. No distention. Musculoskeletal: No lower extremity tenderness nor edema.  No joint effusions. Neurologic:  Normal speech and language. No gross focal neurologic deficits are appreciated. No gait instability. Skin:  Skin is warm, dry and intact. No rash noted. Psychiatric: Mood and affect are normal. Speech and behavior are normal.  ____________________________________________   LABS (all labs ordered are listed, but only abnormal results are displayed)  Labs Reviewed - No data to display PROCEDURES  Procedure(s) performed: None  Critical Care performed: No  ____________________________________________   INITIAL IMPRESSION / ASSESSMENT AND  PLAN / ED COURSE  Pertinent labs & imaging results that were available during my care of the patient were reviewed by me and considered in my medical decision making (see chart for details).  Patient is to keep his appointment at 2:00 today with his doctor at [facility]. No prescription was given as he got a prescription for oxycodone 5 mg #90 on 8/11 ____________________________________________   FINAL CLINICAL IMPRESSION(S) / ED DIAGNOSES  Final diagnoses:  Pain, joint, shoulder region, right  Status post rotator cuff surgery      Tommi Rumps, PA-C 02/01/15 1126  Jene Every, MD 02/01/15 [phone]

## 2015-04-29 ENCOUNTER — Emergency Department: Payer: Self-pay

## 2015-04-29 ENCOUNTER — Emergency Department
Admission: EM | Admit: 2015-04-29 | Discharge: 2015-04-29 | Disposition: A | Discharge status: Home / Self Care | Payer: Self-pay | Attending: Emergency Medicine | Admitting: Emergency Medicine

## 2015-04-29 DIAGNOSIS — M5416 Radiculopathy, lumbar region: Secondary | ICD-10-CM | POA: Insufficient documentation

## 2015-04-29 DIAGNOSIS — M549 Dorsalgia, unspecified: Secondary | ICD-10-CM

## 2015-04-29 DIAGNOSIS — Z79899 Other long term (current) drug therapy: Secondary | ICD-10-CM | POA: Insufficient documentation

## 2015-04-29 DIAGNOSIS — Z791 Long term (current) use of non-steroidal anti-inflammatories (NSAID): Secondary | ICD-10-CM | POA: Insufficient documentation

## 2015-04-29 DIAGNOSIS — R200 Anesthesia of skin: Secondary | ICD-10-CM | POA: Insufficient documentation

## 2015-04-29 MED ORDER — OXYCODONE-ACETAMINOPHEN 5-325 MG PO TABS
ORAL_TABLET | ORAL | Status: AC
  Administered 2015-04-29: 1 via ORAL
  Filled 2015-04-29: qty 1

## 2015-04-29 MED ORDER — CARISOPRODOL 350 MG PO TABS: 350.0000 mg | ORAL_TABLET | Freq: Three times a day (TID) | ORAL | Status: DC | PRN

## 2015-04-29 MED ORDER — OXYCODONE-ACETAMINOPHEN 5-325 MG PO TABS: 1.0000 | ORAL_TABLET | Freq: Four times a day (QID) | ORAL | Status: DC | PRN

## 2015-04-29 MED ORDER — METHYLPREDNISOLONE 4 MG PO TABS: 4.0000 mg | ORAL_TABLET | Freq: Every day | ORAL | Status: DC

## 2015-04-29 MED ORDER — PREDNISONE 20 MG PO TABS
ORAL_TABLET | ORAL | Status: AC
  Administered 2015-04-29: 60 mg via ORAL
  Filled 2015-04-29: qty 3

## 2015-04-29 MED ORDER — DIAZEPAM 5 MG PO TABS
ORAL_TABLET | ORAL | Status: AC
  Administered 2015-04-29: 5 mg via ORAL
  Filled 2015-04-29: qty 1

## 2015-04-29 MED ORDER — OXYCODONE-ACETAMINOPHEN 5-325 MG PO TABS
1.0000 | ORAL_TABLET | Freq: Once | ORAL | Status: AC
  Administered 2015-04-29: 1 via ORAL

## 2015-04-29 MED ORDER — DIAZEPAM 5 MG PO TABS
5.0000 mg | ORAL_TABLET | Freq: Once | ORAL | Status: AC
  Administered 2015-04-29: 5 mg via ORAL

## 2015-04-29 MED ORDER — PREDNISONE 20 MG PO TABS
60.0000 mg | ORAL_TABLET | Freq: Once | ORAL | Status: AC
  Administered 2015-04-29: 60 mg via ORAL

## 2015-04-29 NOTE — Discharge Instructions (Signed)

## 2015-04-29 NOTE — ED Notes (Signed)
Pt states that he bent over last Monday and felt a snap in his back and has been having pain ever since, pt states that every once in awhile he has pain in the rt leg, pt reports recent injury involving his rt arm that he is having therapy for

## 2015-04-29 NOTE — ED Provider Notes (Signed)
[facility] Emergency Department Provider Note  ____________________________________________  Time seen: Approximately 640 PM  I have reviewed the triage vital signs and the nursing notes.   HISTORY  Chief Complaint Back Pain    HPI Victor Logan is a 44 y.o. male who is presenting today with 1 week of low back pain. He said that he was bending over when he felt a "snap" in his back. He has been having pain ever since with spasm. He says that he is visiting ibuprofen, Percocet as well as Flexeril which has not relieved his pain. He says that the pain is across his lower back. He is also reporting intermittent numbness to the right buttock and right lateral thigh which lasts for several minutes at a time and then resolves. He denies any weakness to the lower extremities. He does say though that it hurts to walk secondary to the back pain. He denies any bowel or bladder continence. Has the Flexeril as well as the other pain medications because he is currently undergoing therapy for right rotator cuff injury. [facility]   No past medical history on file.  There are no active problems to display for this patient.   Past Surgical History  Procedure Laterality Date  . Hernia repair    . Shoulder surgery Right 12/2014    Current Outpatient Rx  Name  Route  Sig  Dispense  Refill  . alprazolam (XANAX) 2 MG tablet   Oral   Take 2 mg by mouth 3 (three) times daily.         . diclofenac (VOLTAREN) 75 MG EC tablet   Oral   Take 1 tablet (75 mg total) by mouth 2 (two) times daily.   30 tablet   0   . ibuprofen (ADVIL,MOTRIN) 600 MG tablet   Oral   Take 600 mg by mouth every 6 (six) hours as needed. For pain         . oxyCODONE-acetaminophen (PERCOCET) 5-325 MG per tablet   Oral   Take 1 tablet by mouth every 4 (four) hours as needed for severe pain.   20 tablet   0   . pregabalin (LYRICA) 150 MG capsule  Oral   Take 150 mg by mouth 2 (two) times daily.           Allergies Hydrocodone  No family history on file.  Social History Social History  Substance Use Topics  . Smoking status: Never Smoker   . Smokeless tobacco: Not on file  . Alcohol Use: Yes     Comment: occassional    Review of Systems Constitutional: No fever/chills Eyes: No visual changes. ENT: No sore throat. Cardiovascular: Denies chest pain. Respiratory: Denies shortness of breath. Gastrointestinal: No abdominal pain.  No nausea, no vomiting.  No diarrhea.  No constipation. Genitourinary: Negative for dysuria. Musculoskeletal: As above  Skin: Negative for rash. Neurological: Negative for headaches, focal weakness or numbness.  10-point ROS otherwise negative.  ____________________________________________   PHYSICAL EXAM:  VITAL SIGNS: ED Triage Vitals  Enc Vitals Group     BP 04/29/15 1711 129/104 mmHg     Pulse Rate 04/29/15 1711 81     Resp 04/29/15 1711 18     Temp 04/29/15 1711 98.6 F (37 C)     Temp Source 04/29/15 1711 Oral     SpO2 04/29/15 1711 96 %     Weight 04/29/15 1711 230 lb (104.327 kg)  Height 04/29/15 1711  (1.778 m)     Head Cir --      Peak Flow --      Pain Score 04/29/15 1712 9     Pain Loc --      Pain Edu? --      Excl. in GC? --     Constitutional: Alert and oriented. Well appearing and in no acute distress. Eyes: Conjunctivae are normal. PERRL. EOMI. Head: Atraumatic. Nose: No congestion/rhinnorhea. Mouth/Throat: Mucous membranes are moist.  Neck: No stridor.   Cardiovascular: Normal rate, regular rhythm. Grossly normal heart sounds.  Good peripheral circulation. Respiratory: Normal respiratory effort.  No retractions. Lungs CTAB. Gastrointestinal: Soft and nontender. No distention. No abdominal bruits. No CVA tenderness. Musculoskeletal: No lower extremity tenderness nor edema.  No joint effusions. Tenderness to the midline as well as left lumbar  regions. There is no step-off or deformity. The patient does not have any saddle anesthesia. He has 5 out of 5 strength in bilateral lower extremities including to plantarflexion.   Neurologic:  Normal speech and language. No gross focal neurologic deficits are appreciated. No gait instability. Skin:  Skin is warm, dry and intact. No rash noted. Psychiatric: Mood and affect are normal. Speech and behavior are normal.  ____________________________________________   LABS (all labs ordered are listed, but only abnormal results are displayed)  Labs Reviewed - No data to display ____________________________________________  EKG   ____________________________________________  RADIOLOGY  No acute lumbar spine fracture or malalignment. Multilevel disc bulges and protrusions with annular fissures. No canal stenosis. Mild left L3 and L4 and L5 foraminal narrowing. ____________________________________________   PROCEDURES   ____________________________________________   INITIAL IMPRESSION / ASSESSMENT AND PLAN / ED COURSE  Pertinent labs & imaging results that were available during my care of the patient were reviewed by me and considered in my medical decision making (see chart for details).  ----------------------------------------- 8:10 PM on 04/29/2015 ----------------------------------------- Patient with only minimal pain relief with Valium as well as prednisone. We'll give Percocet.At this point I ordered an MRI to evaluate the patient's back further because of failure of the treatment to provide significant relief.  ----------------------------------------- 10:45 PM on 04/29/2015 -----------------------------------------  Patient resting her family in bed with improved symptoms. I reviewed the MRI report as well as giving him a copy of it. He will take it with him to follow-up with his orthopedic doctor at [facility]. I will start him on a steroid pack. I will also give him a  different muscle relaxer, Soma. I'll also discharge him with several Percocet. He said he is currently out of all of his muscle relaxers and pain medication. No evidence of cord compression or emergent finding on MRI. We'll discharge to home. Feel that there may be some radiculopathy but that the majority of the patient's pain is musculoskeletal related. ____________________________________________   FINAL CLINICAL IMPRESSION(S) / ED DIAGNOSES  Final diagnoses:  Back pain   lumbar radiculopathy.    Myrna Blazer, MD 04/29/15 [id number]

## 2015-04-29 NOTE — ED Notes (Signed)
Patient transported to MRI 

## 2015-04-29 NOTE — ED Notes (Signed)
Mri will be in around 930pm - dr and pt aware

## 2016-05-17 ENCOUNTER — Encounter: Payer: Self-pay | Admitting: Emergency Medicine

## 2016-05-17 ENCOUNTER — Emergency Department
Admission: EM | Admit: 2016-05-17 | Discharge: 2016-05-17 | Disposition: A | Discharge status: Home / Self Care | Payer: Self-pay | Attending: Emergency Medicine | Admitting: Emergency Medicine

## 2016-05-17 DIAGNOSIS — Z791 Long term (current) use of non-steroidal anti-inflammatories (NSAID): Secondary | ICD-10-CM | POA: Insufficient documentation

## 2016-05-17 DIAGNOSIS — M62830 Muscle spasm of back: Secondary | ICD-10-CM | POA: Insufficient documentation

## 2016-05-17 DIAGNOSIS — Z79899 Other long term (current) drug therapy: Secondary | ICD-10-CM | POA: Insufficient documentation

## 2016-05-17 MED ORDER — METHYLPREDNISOLONE SODIUM SUCC 125 MG IJ SOLR
80.0000 mg | Freq: Once | INTRAMUSCULAR | Status: DC
  Filled 2016-05-17: qty 2

## 2016-05-17 MED ORDER — OXYCODONE-ACETAMINOPHEN 5-325 MG PO TABS: 1.0000 | ORAL_TABLET | Freq: Four times a day (QID) | ORAL | 0 refills | Status: DC | PRN

## 2016-05-17 MED ORDER — METHYLPREDNISOLONE SODIUM SUCC 125 MG IJ SOLR
80.0000 mg | Freq: Once | INTRAMUSCULAR | Status: AC
  Administered 2016-05-17: 80 mg via INTRAMUSCULAR

## 2016-05-17 MED ORDER — CYCLOBENZAPRINE HCL 10 MG PO TABS: 10.0000 mg | ORAL_TABLET | Freq: Three times a day (TID) | ORAL | 0 refills | Status: DC | PRN

## 2016-05-17 MED ORDER — HYDROMORPHONE HCL 1 MG/ML IJ SOLN
1.0000 mg | Freq: Once | INTRAMUSCULAR | Status: AC
  Administered 2016-05-17: 1 mg via INTRAMUSCULAR
  Filled 2016-05-17: qty 1

## 2016-05-17 MED ORDER — ONDANSETRON 4 MG PO TBDP
4.0000 mg | ORAL_TABLET | Freq: Once | ORAL | Status: AC
  Administered 2016-05-17: 4 mg via ORAL
  Filled 2016-05-17: qty 1

## 2016-05-17 MED ORDER — ORPHENADRINE CITRATE 30 MG/ML IJ SOLN
60.0000 mg | Freq: Two times a day (BID) | INTRAMUSCULAR | Status: DC
  Administered 2016-05-17: 60 mg via INTRAMUSCULAR
  Filled 2016-05-17: qty 2

## 2016-05-17 MED ORDER — METHYLPREDNISOLONE 4 MG PO TBPK: ORAL_TABLET | ORAL | 0 refills | Status: DC

## 2016-05-17 NOTE — ED Notes (Signed)
Pt states back pain that began Thursday but last night spasms started. "it took me 3 hours to get from the bedroom to the car." States L side and hip spasms. Any movement he states makes spasms worse. Denies injury.

## 2016-05-17 NOTE — ED Notes (Signed)
Went over DC papers with pt and attempted to get him into wheelchair. Pt states spasms are so bad, wanted to see provider again.

## 2016-05-17 NOTE — ED Notes (Signed)
Pt sitting in wheelchair for few minutes to see if spasm subside before DC

## 2016-05-17 NOTE — ED Notes (Signed)
Went back to pt room to re-evaluate and pt had told Sherrill Raringuss, ED medic that he wanted to leave. This RN had previously gone over DC papers with pt but pt had not signed yet. Pt was no longer in room when this RN went back to re-evaluate. Wife had wheeled pt to car.

## 2016-05-17 NOTE — ED Provider Notes (Signed)
[facility] Emergency Department Provider Note   ____________________________________________   First MD Initiated Contact with Patient 05/17/16 1240     (approximate)  I have reviewed the triage vital signs and the nursing notes.   HISTORY  Chief Complaint Back Pain    HPI Victor Logan is a 45 y.o. male patient complain of acute low back pain for 2 days. Patient denies fall or other injury. Patient state he is having severe muscle spasm with this complaint. Patient denies any radicular component to his back pain.Patient has bilateral bowel dysfunction. Patient rates his pain as a 10 over 10. Patient describes the pain as "sharp and spasmatic". No palliative measures for this complaint. 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   History reviewed. No pertinent past medical history.  There are no active problems to display for this patient.   Past Surgical History:  Procedure Laterality Date  . HERNIA REPAIR    . SHOULDER SURGERY Right 12/2014    Prior to Admission medications   Medication Sig Start Date End Date Taking? Authorizing Provider  alprazolam Prudy Feeler(XANAX) 2 MG tablet Take 2 mg by mouth 3 (three) times daily.    Historical Provider, MD  carisoprodol (SOMA) 350 MG tablet Take 1 tablet (350 mg total) by mouth 3 (three) times daily as needed for muscle spasms. 04/29/15   Myrna Blazeravid Matthew Schaevitz, MD  cyclobenzaprine (FLEXERIL) 10 MG tablet Take 1 tablet (10 mg total) by mouth 3 (three) times daily as needed. 05/17/16   Joni Reiningonald K Kourtlyn Charlet, PA-C  diclofenac (VOLTAREN) 75 MG EC tablet Take 1 tablet (75 mg total) by mouth 2 (two) times daily. 12/14/14   Tommi Rumpshonda L Summers, PA-C  ibuprofen (ADVIL,MOTRIN) 600 MG tablet Take 600 mg by mouth every 6 (six) hours as needed. For pain    Historical Provider, MD  methylPREDNISolone (MEDROL DOSEPAK) 4 MG TBPK tablet Take Tapered dose as directed 05/17/16   Joni Reiningonald K Genevie Elman,  PA-C  methylPREDNISolone (MEDROL) 4 MG tablet Take 1 tablet (4 mg total) by mouth daily. 04/29/15   Myrna Blazeravid Matthew Schaevitz, MD  oxyCODONE-acetaminophen (ROXICET) 5-325 MG tablet Take 1-2 tablets by mouth every 6 (six) hours as needed. 04/29/15   Myrna Blazeravid Matthew Schaevitz, MD  oxyCODONE-acetaminophen (ROXICET) 5-325 MG tablet Take 1 tablet by mouth every 6 (six) hours as needed. 05/17/16 05/17/17  Joni Reiningonald K Willean Schurman, PA-C  pregabalin (LYRICA) 150 MG capsule Take 150 mg by mouth 2 (two) times daily.    Historical Provider, MD    Allergies Hydrocodone  No family history on file.  Social History Social History  Substance Use Topics  . Smoking status: Never Smoker  . Smokeless tobacco: Not on file  . Alcohol use Yes     Comment: occassional    Review of Systems Constitutional: No fever/chills Eyes: No visual changes. ENT: No sore throat. Cardiovascular: Denies chest pain. Respiratory: Denies shortness of breath. Gastrointestinal: No abdominal pain.  No nausea, no vomiting.  No diarrhea.  No constipation. Genitourinary: Negative for dysuria. Musculoskeletal: Positive for back pain. Skin: Negative for rash. Neurological: Negative for headaches, focal weakness or numbness. Allergic/Immunilogical: Hydrocodone  ____________________________________________   PHYSICAL EXAM:  VITAL SIGNS: ED Triage Vitals  Enc Vitals Group     BP 05/17/16 1226 (!) 142/97     Pulse Rate 05/17/16 1226 87     Resp 05/17/16 1226 18     Temp 05/17/16 1226 98 F (36.7 C)  Temp Source 05/17/16 1226 Oral     SpO2 05/17/16 1226 95 %     Weight 05/17/16 1227 225 lb (102.1 kg)     Height 05/17/16 1227 5\' 10"  (1.778 m)     Head Circumference --      Peak Flow --      Pain Score 05/17/16 1227 10     Pain Loc --      Pain Edu? --      Excl. in GC? --     Constitutional: Alert and oriented. Well appearing and in no acute distress. Eyes: Conjunctivae are normal. PERRL. EOMI. Head: Atraumatic. Nose: No  congestion/rhinnorhea. Mouth/Throat: Mucous membranes are moist.  Oropharynx non-erythematous. Neck: No stridor.  No cervical spine tenderness to palpation. Hematological/Lymphatic/Immunilogical: No cervical lymphadenopathy. Cardiovascular: Normal rate, regular rhythm. Grossly normal heart sounds.  Good peripheral circulation. Respiratory: Normal respiratory effort.  No retractions. Lungs CTAB. Gastrointestinal: Soft and nontender. No distention. No abdominal bruits. No CVA tenderness. Musculoskeletal: No obvious spinal deformity. Patient decreased range of motion all fields. Patient has moderate guarding palpation L2-L5. Patient had negative straight leg test.  Neurologic:  Normal speech and language. No gross focal neurologic deficits are appreciated. No gait instability. Skin:  Skin is warm, dry and intact. No rash noted. Psychiatric: Mood and affect are normal. Speech and behavior are normal.  ____________________________________________   LABS (all labs ordered are listed, but only abnormal results are displayed)  Labs Reviewed - No data to display ____________________________________________  EKG   ____________________________________________  RADIOLOGY   ____________________________________________   PROCEDURES  Procedure(s) performed: None  Procedures  Critical Care performed: No  ____________________________________________   INITIAL IMPRESSION / ASSESSMENT AND PLAN / ED COURSE  Pertinent labs & imaging results that were available during my care of the patient were reviewed by me and considered in my medical decision making (see chart for details).  Low back pain. Patient given discharge Instructions. Patient given prescription of oxycodone, Flexeril, and Medrol Dosepak. [facility]  Clinical Course      ____________________________________________   FINAL CLINICAL IMPRESSION(S) / ED  DIAGNOSES  Final diagnoses:  Muscle spasm of back      NEW MEDICATIONS STARTED DURING THIS VISIT:  New Prescriptions   CYCLOBENZAPRINE (FLEXERIL) 10 MG TABLET    Take 1 tablet (10 mg total) by mouth 3 (three) times daily as needed.   METHYLPREDNISOLONE (MEDROL DOSEPAK) 4 MG TBPK TABLET    Take Tapered dose as directed   OXYCODONE-ACETAMINOPHEN (ROXICET) 5-325 MG TABLET    Take 1 tablet by mouth every 6 (six) hours as needed.     Note:  This document was prepared using Dragon voice recognition software and may include unintentional dictation errors.    Joni Reiningonald K Garnett Rekowski, PA-C 05/17/16 1253    Joni Reiningonald K Payeton Germani, PA-C 05/17/16 1254    Sharyn CreamerMark Quale, MD 05/17/16 [phone]

## 2016-05-17 NOTE — ED Triage Notes (Signed)
Has had low back pain x 2 days, denies fall or injury at onset, now having severe intermittent spasm.

## 2016-10-02 ENCOUNTER — Emergency Department
Admission: EM | Admit: 2016-10-02 | Discharge: 2016-10-02 | Disposition: A | Discharge status: Home / Self Care | Payer: Self-pay | Attending: Emergency Medicine | Admitting: Emergency Medicine

## 2016-10-02 ENCOUNTER — Encounter: Payer: Self-pay | Admitting: Emergency Medicine

## 2016-10-02 DIAGNOSIS — M62830 Muscle spasm of back: Secondary | ICD-10-CM | POA: Insufficient documentation

## 2016-10-02 MED ORDER — LIDOCAINE 5 % EX PTCH: 1.0000 | MEDICATED_PATCH | Freq: Two times a day (BID) | CUTANEOUS | 0 refills | Status: AC

## 2016-10-02 MED ORDER — OXYCODONE-ACETAMINOPHEN 5-325 MG PO TABS: 1.0000 | ORAL_TABLET | Freq: Four times a day (QID) | ORAL | 0 refills | Status: DC | PRN

## 2016-10-02 MED ORDER — ORPHENADRINE CITRATE 30 MG/ML IJ SOLN
60.0000 mg | Freq: Two times a day (BID) | INTRAMUSCULAR | Status: DC
  Administered 2016-10-02: 60 mg via INTRAMUSCULAR

## 2016-10-02 MED ORDER — OXYCODONE-ACETAMINOPHEN 5-325 MG PO TABS
1.0000 | ORAL_TABLET | Freq: Once | ORAL | Status: AC
  Administered 2016-10-02: 1 via ORAL
  Filled 2016-10-02: qty 1

## 2016-10-02 MED ORDER — KETOROLAC TROMETHAMINE 60 MG/2ML IM SOLN
30.0000 mg | Freq: Once | INTRAMUSCULAR | Status: AC
  Administered 2016-10-02: 30 mg via INTRAMUSCULAR

## 2016-10-02 MED ORDER — DIAZEPAM 2 MG PO TABS
2.0000 mg | ORAL_TABLET | Freq: Once | ORAL | Status: AC
  Administered 2016-10-02: 2 mg via ORAL
  Filled 2016-10-02: qty 1

## 2016-10-02 MED ORDER — KETOROLAC TROMETHAMINE 30 MG/ML IJ SOLN
INTRAMUSCULAR | Status: AC
  Filled 2016-10-02: qty 1

## 2016-10-02 MED ORDER — CYCLOBENZAPRINE HCL 5 MG PO TABS: 5.0000 mg | ORAL_TABLET | Freq: Three times a day (TID) | ORAL | 0 refills | Status: AC | PRN

## 2016-10-02 MED ORDER — ORPHENADRINE CITRATE 30 MG/ML IJ SOLN
INTRAMUSCULAR | Status: AC
  Filled 2016-10-02: qty 2

## 2016-10-02 NOTE — ED Notes (Signed)
See triage note  States he stepped in a hole yesterday  Having pain to left mid back  States pian does not radiate into left leg  States pain became worse during the night and this am

## 2016-10-02 NOTE — ED Triage Notes (Signed)
Pt presents to ED with c/o mid back spasms. Pt states he stepped in a hole outside with his left leg yesterday evening. Some back discomfort by the time he went to bed that has gradually gotten worse. Now having frequent back spasms. Pt states he took a hot shower X2 with no relief.

## 2016-10-02 NOTE — ED Provider Notes (Signed)
[facility] Emergency Department Provider Note  ____________________________________________  Time seen: Approximately 7:28 AM  I have reviewed the triage vital signs and the nursing notes.   HISTORY  Chief Complaint Back Pain    HPI Victor Logan is a 46 y.o. male that presents to the emergency department with mid back pain for one day. Patient states that he stepped in a hole yesterday and has been having back spasms since. He took meloxicam, Tylenol, applied Biofreeze last night with no relief. This has happened before and has been relieved with Toradol, oxycodone, and Valium. He did not fall. No additional injuries. He denies shortness of breath, chest pain, nausea, vomiting, abdominal pain, bowel or bladder dysfunction.   History reviewed. No pertinent past medical history.  There are no active problems to display for this patient.   Past Surgical History:  Procedure Laterality Date  . HERNIA REPAIR    . SHOULDER SURGERY Right 12/2014    Prior to Admission medications   Medication Sig Start Date End Date Taking? Authorizing Provider  alprazolam Prudy Feeler) 2 MG tablet Take 2 mg by mouth 3 (three) times daily.    Historical Provider, MD  carisoprodol (SOMA) 350 MG tablet Take 1 tablet (350 mg total) by mouth 3 (three) times daily as needed for muscle spasms. 04/29/15   Myrna Blazer, MD  cyclobenzaprine (FLEXERIL) 5 MG tablet Take 1 tablet (5 mg total) by mouth 3 (three) times daily as needed for muscle spasms. 10/02/16 10/09/16  Enid Derry, PA-C  diclofenac (VOLTAREN) 75 MG EC tablet Take 1 tablet (75 mg total) by mouth 2 (two) times daily. 12/14/14   Tommi Rumps, PA-C  ibuprofen (ADVIL,MOTRIN) 600 MG tablet Take 600 mg by mouth every 6 (six) hours as needed. For pain    Historical Provider, MD  lidocaine (LIDODERM) 5 % Place 1 patch onto the skin every 12 (twelve) hours. Remove & Discard patch within 12 hours or as directed by MD 10/02/16  10/02/17  Enid Derry, PA-C  methylPREDNISolone (MEDROL DOSEPAK) 4 MG TBPK tablet Take Tapered dose as directed 05/17/16   Joni Reining, PA-C  methylPREDNISolone (MEDROL) 4 MG tablet Take 1 tablet (4 mg total) by mouth daily. 04/29/15   Myrna Blazer, MD  oxyCODONE-acetaminophen (ROXICET) 5-325 MG tablet Take 1 tablet by mouth every 6 (six) hours as needed. 10/02/16 10/02/17  Enid Derry, PA-C  pregabalin (LYRICA) 150 MG capsule Take 150 mg by mouth 2 (two) times daily.    Historical Provider, MD    Allergies Hydrocodone  No family history on file.  Social History Social History  Substance Use Topics  . Smoking status: Never Smoker  . Smokeless tobacco: Not on file  . Alcohol use No     Review of Systems  Constitutional: No fever/chills ENT: No upper respiratory complaints. Cardiovascular: No chest pain. Respiratory: No cough. No SOB. Gastrointestinal: No abdominal pain.  No nausea, no vomiting.  Musculoskeletal: Positive for back pain. Skin: Negative for rash, abrasions, lacerations, ecchymosis. Neurological: Negative for headaches, numbness or tingling   ____________________________________________   PHYSICAL EXAM:  VITAL SIGNS: ED Triage Vitals  Enc Vitals Group     BP 10/02/16 0624 138/81     Pulse Rate 10/02/16 0624 79     Resp 10/02/16 0624 20     Temp 10/02/16 0624 98.3 F (36.8 C)     Temp Source 10/02/16 0624 Oral     SpO2 10/02/16 0624 96 %  Weight 10/02/16 0625 215 lb (97.5 kg)     Height 10/02/16 0625  (1.778 m)     Head Circumference --      Peak Flow --      Pain Score 10/02/16 0624 6     Pain Loc --      Pain Edu? --      Excl. in GC? --    Eyes: Conjunctivae are normal. PERRL. EOMI. Head: Atraumatic. ENT:      Ears:      Nose: No congestion/rhinnorhea.      Mouth/Throat: Mucous membranes are moist.  Neck: No stridor.  Cardiovascular: Normal rate, regular rhythm.  Good peripheral circulation. Respiratory: Normal  respiratory effort without tachypnea or retractions. Lungs CTAB. Good air entry to the bases with no decreased or absent breath sounds. Gastrointestinal: Bowel sounds 4 quadrants. Soft and nontender to palpation. No guarding or rigidity. No palpable masses. No distention.  Musculoskeletal: Full range of motion to all extremities. No gross deformities appreciated. No tenderness to palpation over lumbar or thoracic spine. Tenderness to palpation diffusely over muscles. Negative straight leg raise. Negative cross leg raise. Neurologic:  Normal speech and language. No gross focal neurologic deficits are appreciated.  Skin:  Skin is warm, dry and intact. No rash noted.   ____________________________________________   LABS (all labs ordered are listed, but only abnormal results are displayed)  Labs Reviewed - No data to display ____________________________________________  EKG   ____________________________________________  RADIOLOGY  No results found.  ____________________________________________    PROCEDURES  Procedure(s) performed:    Procedures    Medications  ketorolac (TORADOL) injection 30 mg (30 mg Intramuscular Given 10/02/16 0735)  oxyCODONE-acetaminophen (PERCOCET/ROXICET) 5-325 MG per tablet 1 tablet (1 tablet Oral Given 10/02/16 0827)  diazepam (VALIUM) tablet 2 mg (2 mg Oral Given 10/02/16 0827)     ____________________________________________   INITIAL IMPRESSION / ASSESSMENT AND PLAN / ED COURSE  Pertinent labs & imaging results that were available during my care of the patient were reviewed by me and considered in my medical decision making (see chart for details).  Review of the [place] CSRS was performed in accordance of the [facility] prior to dispensing any controlled drugs.   Patient's diagnosis is consistent with muscle spasms. Vital signs and exam are reassuring. Patient received Toradol and Norflex in ED and symptoms improved but patient is still in  pain.  Pain is improved greatly with Percocet and Valium. Patient will be discharged home with prescriptions for Flexeril, lidoderm, and a short course of Percocet. Patient is to follow up with PCP as directed. Patient is given ED precautions to return to the ED for any worsening or new symptoms.     ____________________________________________  FINAL CLINICAL IMPRESSION(S) / ED DIAGNOSES  Final diagnoses:  Muscle spasm of back      NEW MEDICATIONS STARTED DURING THIS VISIT:  Discharge Medication List as of 10/02/2016  9:01 AM    START taking these medications   Details  lidocaine (LIDODERM) 5 % Place 1 patch onto the skin every 12 (twelve) hours. Remove & Discard patch within 12 hours or as directed by MD, Starting Thu 10/02/2016, Until Fri 10/02/2017, Print            This chart was dictated using voice recognition software/Dragon. Despite best efforts to proofread, errors can occur which can change the meaning. Any change was purely unintentional.    Enid Derry, PA-C 10/02/16 1437    Emily Filbert, MD 10/02/16 [phone]

## 2017-01-15 ENCOUNTER — Emergency Department
Admission: EM | Admit: 2017-01-15 | Discharge: 2017-01-15 | Disposition: A | Discharge status: Home / Self Care | Payer: BLUE CROSS/BLUE SHIELD | Attending: Emergency Medicine | Admitting: Emergency Medicine

## 2017-01-15 ENCOUNTER — Encounter: Payer: Self-pay | Admitting: Emergency Medicine

## 2017-01-15 DIAGNOSIS — M25532 Pain in left wrist: Secondary | ICD-10-CM | POA: Diagnosis present

## 2017-01-15 DIAGNOSIS — M5410 Radiculopathy, site unspecified: Secondary | ICD-10-CM | POA: Insufficient documentation

## 2017-01-15 DIAGNOSIS — M67432 Ganglion, left wrist: Secondary | ICD-10-CM | POA: Insufficient documentation

## 2017-01-15 DIAGNOSIS — Z791 Long term (current) use of non-steroidal anti-inflammatories (NSAID): Secondary | ICD-10-CM | POA: Diagnosis not present

## 2017-01-15 DIAGNOSIS — Z79899 Other long term (current) drug therapy: Secondary | ICD-10-CM | POA: Insufficient documentation

## 2017-01-15 MED ORDER — KETOROLAC TROMETHAMINE 30 MG/ML IJ SOLN
30.0000 mg | Freq: Once | INTRAMUSCULAR | Status: DC
  Filled 2017-01-15: qty 1

## 2017-01-15 MED ORDER — IBUPROFEN 600 MG PO TABS: 600.0000 mg | ORAL_TABLET | Freq: Four times a day (QID) | ORAL | 0 refills | Status: DC | PRN

## 2017-01-15 MED ORDER — OXYCODONE-ACETAMINOPHEN 5-325 MG PO TABS: 1.0000 | ORAL_TABLET | Freq: Four times a day (QID) | ORAL | 0 refills | Status: AC | PRN

## 2017-01-15 MED ORDER — METHYLPREDNISOLONE SODIUM SUCC 125 MG IJ SOLR
125.0000 mg | Freq: Once | INTRAMUSCULAR | Status: AC
  Administered 2017-01-15: 125 mg via INTRAMUSCULAR
  Filled 2017-01-15: qty 2

## 2017-01-15 MED ORDER — KETOROLAC TROMETHAMINE 30 MG/ML IJ SOLN
30.0000 mg | Freq: Once | INTRAMUSCULAR | Status: AC
Start: 2017-01-15 — End: 2017-01-15
  Administered 2017-01-15: 30 mg via INTRAMUSCULAR

## 2017-01-15 NOTE — ED Triage Notes (Signed)
Pt presents to ED c/o left wrist pain x2 weeks that is progressive. Describes pain as shooting pain with numbness that wakes him up at night. Also points to nodule on left wrist

## 2017-01-15 NOTE — ED Provider Notes (Signed)
[facility] Emergency Department Provider Note  ____________________________________________  Time seen: Approximately 11:51 AM  I have reviewed the triage vital signs and the nursing notes.   HISTORY  Chief Complaint Wrist Pain    HPI Victor Logan is a 46 y.o. male that presents to emergency department with left wrist pain for 3 weeks. Patient noticed a lump on the underside of his left wrist 3 weeks ago. He feels like the lump gets larger the more he uses his wrist. Twisting movemens make the pain worse. Pain starts at wrist sharp pains radiate up arm and into his hand. Sometimes he has trouble gripping onto objects because it feels weak. He has difficulty using a wrench and shifting the gears on his motorcycle. He has never injured that wrist. He does a lot of repetitive movements at work. He took a tramadol at home, which did not help. He saw the [facility] yesterday and got a referral. He does not know where the referral was and is worried that he will not get in for a long time. He denies fever, shortness of breath, chest pain, nausea, vomiting, abdominal pain.   History reviewed. No pertinent past medical history.  There are no active problems to display for this patient.   Past Surgical History:  Procedure Laterality Date  . HERNIA REPAIR    . SHOULDER SURGERY Right 12/2014    Prior to Admission medications   Medication Sig Start Date End Date Taking? Authorizing Provider  alprazolam Prudy Feeler) 2 MG tablet Take 2 mg by mouth 3 (three) times daily.    [provider]  carisoprodol (SOMA) 350 MG tablet Take 1 tablet (350 mg total) by mouth 3 (three) times daily as needed for muscle spasms. 04/29/15   Myrna Blazer, MD  diclofenac (VOLTAREN) 75 MG EC tablet Take 1 tablet (75 mg total) by mouth 2 (two) times daily. 12/14/14   Tommi Rumps, PA-C  ibuprofen (ADVIL,MOTRIN) 600 MG tablet Take 1 tablet (600 mg total) by mouth  every 6 (six) hours as needed. 01/15/17   Enid Derry, PA-C  lidocaine (LIDODERM) 5 % Place 1 patch onto the skin every 12 (twelve) hours. Remove & Discard patch within 12 hours or as directed by MD 10/02/16 10/02/17  Enid Derry, PA-C  methylPREDNISolone (MEDROL DOSEPAK) 4 MG TBPK tablet Take Tapered dose as directed 05/17/16   Joni Reining, PA-C  methylPREDNISolone (MEDROL) 4 MG tablet Take 1 tablet (4 mg total) by mouth daily. 04/29/15   Schaevitz, Myra Rude, MD  oxyCODONE-acetaminophen (ROXICET) 5-325 MG tablet Take 1 tablet by mouth every 6 (six) hours as needed. 01/15/17 01/17/17  Enid Derry, PA-C  pregabalin (LYRICA) 150 MG capsule Take 150 mg by mouth 2 (two) times daily.    [provider]    Allergies Hydrocodone  History reviewed. No pertinent family history.  Social History Social History  Substance Use Topics  . Smoking status: Never Smoker  . Smokeless tobacco: Not on file  . Alcohol use No     Review of Systems  Constitutional: No fever/chills Cardiovascular: No chest pain. Respiratory:  No SOB. Gastrointestinal: No abdominal pain.  No nausea, no vomiting.  Musculoskeletal: Positive for wrist pain. Skin: Negative for rash, abrasions, lacerations, ecchymosis. Neurological: Negative for headaches   ____________________________________________   PHYSICAL EXAM:  VITAL SIGNS: ED Triage Vitals [01/15/17 1109]  Enc Vitals Group     BP (!) 140/95     Pulse Rate 83  Resp 18     Temp 98.4 F (36.9 C)     Temp Source Oral     SpO2 98 %     Weight 229 lb (103.9 kg)     Height 5\' 10"  (1.778 m)     Head Circumference      Peak Flow      Pain Score 7     Pain Loc      Pain Edu?      Excl. in GC?      Constitutional: Alert and oriented. Well appearing and in no acute distress. Eyes: Conjunctivae are normal. PERRL. EOMI. Head: Atraumatic. ENT:      Ears:      Nose: No congestion/rhinnorhea.      Mouth/Throat: Mucous membranes are  moist.  Neck: No stridor.   Cardiovascular: Normal rate, regular rhythm.  Good peripheral circulation. 2+ radial pulses. Respiratory: Normal respiratory effort without tachypnea or retractions. Lungs CTAB. Good air entry to the bases with no decreased or absent breath sounds. Musculoskeletal: Full range of motion to all extremities. No gross deformities appreciated. 1/2 cm round, tender, soft, mobile mass on palmar side of left wrist. Strength 5 out of 5 of wrist and hand. Neurologic:  Normal speech and language. No gross focal neurologic deficits are appreciated.  Skin:  Skin is warm, dry and intact. No rash noted.   ____________________________________________   LABS (all labs ordered are listed, but only abnormal results are displayed)  Labs Reviewed - No data to display ____________________________________________  EKG   ____________________________________________  RADIOLOGY  No results found.  ____________________________________________    PROCEDURES  Procedure(s) performed:    Procedures    Medications  methylPREDNISolone sodium succinate (SOLU-MEDROL) 125 mg/2 mL injection 125 mg (125 mg Intramuscular Given 01/15/17 1203)  ketorolac (TORADOL) 30 MG/ML injection 30 mg (30 mg Intramuscular Given 01/15/17 1209)     ____________________________________________   INITIAL IMPRESSION / ASSESSMENT AND PLAN / ED COURSE  Pertinent labs & imaging results that were available during my care of the patient were reviewed by me and considered in my medical decision making (see chart for details).  Review of the [place] CSRS was performed in accordance of the [facility] prior to dispensing any controlled drugs.    Patient presents to emergency department for evaluation of left wrist pain for 3 weeks. Patient's diagnosis is consistent with ganlion cyst. Vital signs and exam are reassuring. We discussed doing an x-ray or ultrasound and it agreed that it would unlikely change management.  Solumedrol was given for radicular symptoms. Wrist splint was provided. Patient will be discharged home with prescriptions for a short course of Percocet and ibuprofen. Patient is to follow up with orthopedics as directed. Patient is given ED precautions to return to the ED for any worsening or new symptoms.     ____________________________________________  FINAL CLINICAL IMPRESSION(S) / ED DIAGNOSES  Final diagnoses:  Ganglion of left wrist  Radiculopathy affecting upper extremity      NEW MEDICATIONS STARTED DURING THIS VISIT:  Discharge Medication List as of 01/15/2017 12:15 PM          This chart was dictated using voice recognition software/Dragon. Despite best efforts to proofread, errors can occur which can change the meaning. Any change was purely unintentional.    Enid DerryWagner, Zarra Geffert, PA-C 01/15/17 1600    Pershing ProudSchaevitz, Myra Rudeavid Matthew, MD 01/16/17 [phone]

## 2017-08-28 ENCOUNTER — Emergency Department: Payer: Self-pay

## 2017-08-28 ENCOUNTER — Encounter: Payer: Self-pay | Admitting: Emergency Medicine

## 2017-08-28 ENCOUNTER — Emergency Department
Admission: EM | Admit: 2017-08-28 | Discharge: 2017-08-28 | Disposition: A | Discharge status: Home / Self Care | Payer: Self-pay | Attending: Student in an Organized Health Care Education/Training Program | Admitting: Student in an Organized Health Care Education/Training Program

## 2017-08-28 DIAGNOSIS — Z79899 Other long term (current) drug therapy: Secondary | ICD-10-CM | POA: Insufficient documentation

## 2017-08-28 DIAGNOSIS — X58XXXA Exposure to other specified factors, initial encounter: Secondary | ICD-10-CM | POA: Insufficient documentation

## 2017-08-28 DIAGNOSIS — M50121 Cervical disc disorder at C4-C5 level with radiculopathy: Secondary | ICD-10-CM | POA: Insufficient documentation

## 2017-08-28 DIAGNOSIS — M5030 Other cervical disc degeneration, unspecified cervical region: Secondary | ICD-10-CM

## 2017-08-28 DIAGNOSIS — S39012A Strain of muscle, fascia and tendon of lower back, initial encounter: Secondary | ICD-10-CM | POA: Insufficient documentation

## 2017-08-28 DIAGNOSIS — Y929 Unspecified place or not applicable: Secondary | ICD-10-CM | POA: Insufficient documentation

## 2017-08-28 DIAGNOSIS — Y9389 Activity, other specified: Secondary | ICD-10-CM | POA: Insufficient documentation

## 2017-08-28 DIAGNOSIS — M5412 Radiculopathy, cervical region: Secondary | ICD-10-CM

## 2017-08-28 DIAGNOSIS — Y998 Other external cause status: Secondary | ICD-10-CM | POA: Insufficient documentation

## 2017-08-28 MED ORDER — KETOROLAC TROMETHAMINE 30 MG/ML IJ SOLN
30.0000 mg | Freq: Once | INTRAMUSCULAR | Status: AC
  Administered 2017-08-28: 30 mg via INTRAMUSCULAR
  Filled 2017-08-28: qty 1

## 2017-08-28 MED ORDER — KETOROLAC TROMETHAMINE 10 MG PO TABS: 10.0000 mg | ORAL_TABLET | Freq: Three times a day (TID) | ORAL | 0 refills | Status: DC

## 2017-08-28 MED ORDER — ORPHENADRINE CITRATE 30 MG/ML IJ SOLN
60.0000 mg | INTRAMUSCULAR | Status: AC
  Administered 2017-08-28: 60 mg via INTRAMUSCULAR
  Filled 2017-08-28: qty 2

## 2017-08-28 MED ORDER — OXYCODONE-ACETAMINOPHEN 5-325 MG PO TABS: 1.0000 | ORAL_TABLET | Freq: Three times a day (TID) | ORAL | 0 refills | Status: AC | PRN

## 2017-08-28 MED ORDER — CYCLOBENZAPRINE HCL 10 MG PO TABS: 5.0000 mg | ORAL_TABLET | Freq: Three times a day (TID) | ORAL | 0 refills | Status: DC | PRN

## 2017-08-28 NOTE — Discharge Instructions (Signed)
Your exam and CT and x-rays are negative for any acute fractures or dislocations. 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 [facility] provider at [facility] or see neurology for continued/worsening symptoms.

## 2017-08-28 NOTE — ED Provider Notes (Signed)
[facility] Emergency Department Provider Note ____________________________________________  Time seen: 1245  I have reviewed the triage vital signs and the nursing notes.  HISTORY  Chief Complaint  Shoulder Pain  HPI Victor Logan is a 47 y.o. male presents to the ED accompanied by his wife, for evaluation of persistent pain to the right shoulder and paresthesias to the upper extremity.  Patient also notes some pain to the lower back.  He describes onset of symptoms occurred about 3 days prior.  During that time he spent about an hour standing up in a chair working overhead to place a ceiling fan.  He denies any fall, injury, or trauma.  There is a remote history of rotator cuff repair some 3 years prior.  He denies any interim complaints or disability.  He is also noted some increased stiffness to the midline of the lower back.  Without any distal paresthesias, footdrop, or incontinence.  He has been taking ibuprofen without significant benefit.  History reviewed. No pertinent past medical history.  There are no active problems to display for this patient.   Past Surgical History:  Procedure Laterality Date  . HERNIA REPAIR    . SHOULDER SURGERY Right 12/2014    Prior to Admission medications   Medication Sig Start Date End Date Taking? Authorizing Provider  alprazolam Prudy Feeler(XANAX) 2 MG tablet Take 2 mg by mouth 3 (three) times daily.    [provider]  carisoprodol (SOMA) 350 MG tablet Take 1 tablet (350 mg total) by mouth 3 (three) times daily as needed for muscle spasms. 04/29/15   Schaevitz, Myra Rudeavid Matthew, MD  cyclobenzaprine (FLEXERIL) 10 MG tablet Take 0.5 tablets (5 mg total) by mouth 3 (three) times daily as needed for muscle spasms. 08/28/17   Griffith Santilli, Charlesetta IvoryJenise V Bacon, PA-C  diclofenac (VOLTAREN) 75 MG EC tablet Take 1 tablet (75 mg total) by mouth 2 (two) times daily. 12/14/14   Tommi RumpsSummers, Rhonda L, PA-C  ibuprofen (ADVIL,MOTRIN) 600 MG tablet Take 1  tablet (600 mg total) by mouth every 6 (six) hours as needed. 01/15/17   Enid DerryWagner, Ashley, PA-C  ketorolac (TORADOL) 10 MG tablet Take 1 tablet (10 mg total) by mouth every 8 (eight) hours. 08/28/17   Iniko Robles, Charlesetta IvoryJenise V Bacon, PA-C  lidocaine (LIDODERM) 5 % Place 1 patch onto the skin every 12 (twelve) hours. Remove & Discard patch within 12 hours or as directed by MD 10/02/16 10/02/17  Enid DerryWagner, Ashley, PA-C  methylPREDNISolone (MEDROL DOSEPAK) 4 MG TBPK tablet Take Tapered dose as directed 05/17/16   Joni ReiningSmith, Ronald K, PA-C  methylPREDNISolone (MEDROL) 4 MG tablet Take 1 tablet (4 mg total) by mouth daily. 04/29/15   Schaevitz, Myra Rudeavid Matthew, MD  oxyCODONE-acetaminophen (PERCOCET) 5-325 MG tablet Take 1 tablet by mouth every 8 (eight) hours as needed for up to 5 days for severe pain. 08/28/17 09/02/17  Era Parr, Charlesetta IvoryJenise V Bacon, PA-C  pregabalin (LYRICA) 150 MG capsule Take 150 mg by mouth 2 (two) times daily.    [provider]   Allergies Hydrocodone  No family history on file.  Social History Social History   Tobacco Use  . Smoking status: Never Smoker  Substance Use Topics  . Alcohol use: No  . Drug use: No    Review of Systems  Constitutional: Negative for fever. Cardiovascular: Negative for chest pain. Respiratory: Negative for shortness of breath. Gastrointestinal: Negative for abdominal pain, vomiting and diarrhea. Genitourinary: Negative for dysuria. Musculoskeletal: Positive for back pain.  Right shoulder pain.  Skin: Negative for rash. Neurological: Negative for headaches, focal weakness or numbness.  Right upper extremity paresthesias as above. ____________________________________________  PHYSICAL EXAM:  VITAL SIGNS: ED Triage Vitals  Enc Vitals Group     BP 08/28/17 1128 140/88     Pulse Rate 08/28/17 1128 68     Resp 08/28/17 1128 20     Temp 08/28/17 1128 98.6 F (37 C)     Temp Source 08/28/17 1128 Oral     SpO2 08/28/17 1128 98 %     Weight 08/28/17 1128 230  lb (104.3 kg)     Height 08/28/17 1128 5\' 10"  (1.778 m)     Head Circumference --      Peak Flow --      Pain Score 08/28/17 1133 8     Pain Loc --      Pain Edu? --      Excl. in GC? --     Constitutional: Alert and oriented. Well appearing and in no distress. Head: Normocephalic and atraumatic. Neck: Supple. No thyromegaly.  Normal range of motion without crepitus. Cardiovascular: Normal rate, regular rhythm. Normal distal pulses. Respiratory: Normal respiratory effort. No wheezes/rales/rhonchi. Musculoskeletal: Normal spinal alignment without midline tenderness, spasm, deformity, or step-off.  Patient transitions from sit to stand slowly.  He is able to demonstrate lumbar flexion to the knees.  Lumbar extension is limited secondary to patient's pain and inability to fully extend even to neutral at the waist.  He is able to demonstrate normal hip flexion on exam.  The right shoulder is without any obvious deformity, dislocation, or sulcus sign.  Patient with normal rotator cuff testing without signs of deficit. Nontender with normal range of motion in all extremities.  Neurologic: Cranial nerves II through XII grossly intact.  Normal UE/LE DTRs bilaterally.  Normal intrinsic and opposition testing.  normal gait without ataxia. Normal speech and language. No gross focal neurologic deficits are appreciated. Skin:  Skin is warm, dry and intact. No rash noted. Psychiatric: Mood and affect are normal. Patient exhibits appropriate insight and judgment. ____________________________________________   RADIOLOGY  CT Cervical Spine w/o CM  IMPRESSION: Moderate spondylosis throughout the cervical spine with disc disease from the C4-5 level to the C6-7 level. No definite focal disc herniation. No significant canal stenosis. Left-sided neural foramen narrowing at the C3-4 and C5-6 levels due to adjacent bony spurring.  Lumbar spine complete  IMPRESSION: No acute abnormality.  Mild multilevel  degenerative changes. ____________________________________________  PROCEDURES  Procedures Toradol 30 mg IM x 1 Norflex 60 mg IM ____________________________________________  INITIAL IMPRESSION / ASSESSMENT AND PLAN / ED COURSE  Patient with a ED evaluation of persistent right shoulder pain along with some right upper extremity paresthesias following overuse.  Patient's exam is overall benign.  He shows a solid intact rotator cuff on the right.  He also has normal neuromuscular testing to the right upper extremity distally.  His symptoms likely represent some cervical radiculopathy due to his underlying DDD and bone spurs.  His lumbar films do not show any acute findings either.  The patient is discharged with prescriptions for Toradol, Flexeril, and oxycodone to dose as directed.  He is referred to neurology for ongoing symptom management.  Return precautions have been reviewed with the patient and his wife.  I reviewed the patient's prescription history over the last 12 months in the multi-state controlled substances database(s) that includes [place], [place], [place], [place], [place], [place], [place], [place], [place], [place], [place], [place],  [place], and [place].  Results were notable for no current narcotic prescriptions.  ____________________________________________  FINAL CLINICAL IMPRESSION(S) / ED DIAGNOSES  Final diagnoses:  Cervical radiculopathy  DDD (degenerative disc disease), cervical  Lumbar strain, initial encounter      Lissa Hoard, PA-C 08/28/17 1850    Willy Eddy, MD 08/29/17 [phone]

## 2017-08-28 NOTE — ED Triage Notes (Signed)
Patient presents to ED via POV from home with c/o right arm and lower back pain. Patient reports he was changing a ceiling fan 3 days ago when the pain began. Hx of rotator cuff surgery to right arm. Patient reports minimal relief with ibuprofen.

## 2018-03-10 ENCOUNTER — Other Ambulatory Visit: Payer: Self-pay

## 2018-03-10 ENCOUNTER — Emergency Department: Payer: Self-pay

## 2018-03-10 ENCOUNTER — Emergency Department
Admission: EM | Admit: 2018-03-10 | Discharge: 2018-03-10 | Disposition: A | Discharge status: Home / Self Care | Payer: Self-pay | Attending: Emergency Medicine | Admitting: Emergency Medicine

## 2018-03-10 DIAGNOSIS — E860 Dehydration: Secondary | ICD-10-CM | POA: Insufficient documentation

## 2018-03-10 DIAGNOSIS — M62838 Other muscle spasm: Secondary | ICD-10-CM | POA: Insufficient documentation

## 2018-03-10 DIAGNOSIS — R112 Nausea with vomiting, unspecified: Secondary | ICD-10-CM | POA: Insufficient documentation

## 2018-03-10 LAB — URINALYSIS, COMPLETE (UACMP) WITH MICROSCOPIC
BACTERIA UA: NONE SEEN
Bilirubin Urine: NEGATIVE
Glucose, UA: NEGATIVE mg/dL
HGB URINE DIPSTICK: NEGATIVE
Ketones, ur: NEGATIVE mg/dL
Leukocytes, UA: NEGATIVE
NITRITE: NEGATIVE
PROTEIN: NEGATIVE mg/dL
SPECIFIC GRAVITY, URINE: 1.017 (ref 1.005–1.030)
pH: 5 (ref 5.0–8.0)

## 2018-03-10 LAB — COMPREHENSIVE METABOLIC PANEL
ALK PHOS: 91 U/L (ref 38–126)
ALT: 26 U/L (ref 0–44)
ANION GAP: 10 (ref 5–15)
AST: 25 U/L (ref 15–41)
Albumin: 4 g/dL (ref 3.5–5.0)
BILIRUBIN TOTAL: 0.5 mg/dL (ref 0.3–1.2)
BUN: 16 mg/dL (ref 6–20)
CALCIUM: 9 mg/dL (ref 8.9–10.3)
CO2: 22 mmol/L (ref 22–32)
CREATININE: 1.01 mg/dL (ref 0.61–1.24)
Chloride: 107 mmol/L (ref 98–111)
GFR calc Af Amer: >60 mL/min (ref >60)
GFR calc non Af Amer: >60 mL/min (ref >60)
Glucose, Bld: 149 mg/dL — ABNORMAL HIGH (ref 70–99)
Potassium: 4.5 mmol/L (ref 3.5–5.1)
Sodium: 139 mmol/L (ref 135–145)
TOTAL PROTEIN: 7.3 g/dL (ref 6.5–8.1)

## 2018-03-10 LAB — LIPASE, BLOOD: Lipase: 35 U/L (ref 11–51)

## 2018-03-10 LAB — CBC
HEMATOCRIT: 42.8 % (ref 40.0–52.0)
HEMOGLOBIN: 14.9 g/dL (ref 13.0–18.0)
MCH: 30.6 pg (ref 26.0–34.0)
MCHC: 34.8 g/dL (ref 32.0–36.0)
MCV: 88 fL (ref 80.0–100.0)
Platelets: 256 10*3/uL (ref 150–440)
RBC: 4.86 MIL/uL (ref 4.40–5.90)
RDW: 14.2 % (ref 11.5–14.5)
WBC: 8.4 10*3/uL (ref 3.8–10.6)

## 2018-03-10 MED ORDER — ONDANSETRON HCL 4 MG/2ML IJ SOLN
4.0000 mg | Freq: Once | INTRAMUSCULAR | Status: AC
Start: 2018-03-10 — End: 2018-03-10
  Administered 2018-03-10: 4 mg via INTRAVENOUS
  Filled 2018-03-10: qty 2

## 2018-03-10 MED ORDER — LORAZEPAM 2 MG/ML IJ SOLN
1.0000 mg | Freq: Once | INTRAMUSCULAR | Status: AC
  Administered 2018-03-10: 1 mg via INTRAVENOUS
  Filled 2018-03-10: qty 1

## 2018-03-10 MED ORDER — MORPHINE SULFATE (PF) 4 MG/ML IV SOLN
4.0000 mg | Freq: Once | INTRAVENOUS | Status: AC
  Administered 2018-03-10: 4 mg via INTRAVENOUS
  Filled 2018-03-10: qty 1

## 2018-03-10 MED ORDER — FAMOTIDINE IN NACL 20-0.9 MG/50ML-% IV SOLN
20.0000 mg | Freq: Once | INTRAVENOUS | Status: AC
  Administered 2018-03-10: 20 mg via INTRAVENOUS
  Filled 2018-03-10: qty 50

## 2018-03-10 MED ORDER — ONDANSETRON 4 MG PO TBDP: 4.0000 mg | ORAL_TABLET | Freq: Three times a day (TID) | ORAL | 0 refills | Status: DC | PRN

## 2018-03-10 MED ORDER — ONDANSETRON HCL 4 MG/2ML IJ SOLN
4.0000 mg | Freq: Once | INTRAMUSCULAR | Status: AC
  Administered 2018-03-10: 4 mg via INTRAVENOUS
  Filled 2018-03-10: qty 2

## 2018-03-10 MED ORDER — DIAZEPAM 5 MG PO TABS: 5.0000 mg | ORAL_TABLET | Freq: Three times a day (TID) | ORAL | 0 refills | Status: DC | PRN

## 2018-03-10 MED ORDER — SODIUM CHLORIDE 0.9 % IV SOLN
Freq: Once | INTRAVENOUS | Status: AC
  Administered 2018-03-10: 08:00:00 via INTRAVENOUS

## 2018-03-10 MED ORDER — OXYCODONE-ACETAMINOPHEN 5-325 MG PO TABS: 1.0000 | ORAL_TABLET | Freq: Three times a day (TID) | ORAL | 0 refills | Status: DC | PRN

## 2018-03-10 NOTE — ED Notes (Signed)
Pt in for vomiting x5 days beginning on Saturday. Pt states last episode of emisis was this morning at 0430 and pt states that he noted small quantities of dark red blood in clear yellowish fluid. Pt has secondary complaint of severe R side mid back pain which Pt associates with frequent vomiting.

## 2018-03-10 NOTE — ED Triage Notes (Signed)
Pt in with vomiting x 3 days denies any diarrhea.

## 2018-03-10 NOTE — ED Notes (Signed)
First Nurse Note: Patient ambulatory to Rm 12, Maisie Fus RN aware of placement.

## 2018-03-10 NOTE — ED Provider Notes (Signed)
[facility] Emergency Department Provider Note       Time seen: ----------------------------------------- 7:56 AM on 03/10/2018 -----------------------------------------   I have reviewed the triage vital signs and the nursing notes.  HISTORY   Chief Complaint Emesis    HPI Victor Logan is a 47 y.o. male with a history of hernia repair and shoulder surgery who presents to the ED for vomiting for 3 days.  Patient states initially symptoms started with dehydration.  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  He states he cannot keep fluids down and feels weak.  He has seen some blood when he throws up, he has low back pain as well he thinks from the vomiting.  He denies any diarrhea or fever.  No past medical history on file.  There are no active problems to display for this patient.   Past Surgical History:  Procedure Laterality Date  . HERNIA REPAIR    . SHOULDER SURGERY Right 12/2014    Allergies Hydrocodone  Social History Social History   Tobacco Use  . Smoking status: Never Smoker  Substance Use Topics  . Alcohol use: No  . Drug use: No   Review of Systems Constitutional: Negative for fever. Cardiovascular: Negative for chest pain. Respiratory: Negative for shortness of breath. Gastrointestinal: Positive for abdominal pain, vomiting Musculoskeletal: Positive for back pain Skin: Negative for rash. Neurological: Negative for headaches, focal weakness or numbness.  All systems negative/normal/unremarkable except as stated in the HPI  ____________________________________________   PHYSICAL EXAM:  VITAL SIGNS: ED Triage Vitals  Enc Vitals Group     BP 03/10/18 0643 (!) 142/80     Pulse Rate 03/10/18 0643 83     Resp 03/10/18 0643 18     Temp 03/10/18 0643 98.2 F (36.8 C)     Temp Source 03/10/18 0643 Oral     SpO2 03/10/18 0643 96 %     Weight 03/10/18 0641 210 lb (95.3 kg)   Height 03/10/18 0641 5\' 10"  (1.778 m)     Head Circumference --      Peak Flow --      Pain Score 03/10/18 0641 6     Pain Loc --      Pain Edu? --      Excl. in GC? --    Constitutional: Alert and oriented. Well appearing and in no distress. Eyes: Conjunctivae are normal. Normal extraocular movements. Cardiovascular: Normal rate, regular rhythm. No murmurs, rubs, or gallops. Respiratory: Normal respiratory effort without tachypnea nor retractions. Breath sounds are clear and equal bilaterally. No wheezes/rales/rhonchi. Gastrointestinal: Right upper quadrant tenderness, no rebound or guarding.  Normal bowel sounds. Musculoskeletal: Nontender with normal range of motion in extremities. No lower extremity tenderness nor edema.  Reproducible right-sided paraspinous muscle tenderness Neurologic:  Normal speech and language. No gross focal neurologic deficits are appreciated.  Skin:  Skin is warm, dry and intact. No rash noted. Psychiatric: Mood and affect are normal. Speech and behavior are normal.  ____________________________________________  ED COURSE:  As part of my medical decision making, I reviewed the following data within the electronic MEDICAL RECORD NUMBER History obtained from family if available, nursing notes, old chart and ekg, as well as notes from prior ED visits. Patient presented for vomiting, we will assess with labs and imaging as indicated at this time.   Procedures ____________________________________________   LABS (pertinent positives/negatives)  Labs Reviewed  COMPREHENSIVE METABOLIC PANEL - Abnormal; Notable  for the following components:      Result Value   Glucose, Bld 149 (*)    All other components within normal limits  URINALYSIS, COMPLETE (UACMP) WITH MICROSCOPIC - Abnormal; Notable for the following components:   Color, Urine YELLOW (*)    APPearance CLEAR (*)    All other components within normal limits  CBC  LIPASE, BLOOD    RADIOLOGY Images were  viewed by me  Right upper quadrant ultrasound IMPRESSION: Diffuse increase in liver echogenicity, a finding felt to be indicative hepatic steatosis. While no focal liver lesions are evident on this study, it must be cautioned that the sensitivity of ultrasound for detection of focal liver lesions is diminished in this circumstance.  Study otherwise unremarkable. ____________________________________________  DIFFERENTIAL DIAGNOSIS   Dehydration, electrolyte abnormality, cholecystitis, cholelithiasis, gastroenteritis, muscle spasm  FINAL ASSESSMENT AND PLAN  Vomiting, muscle spasm    Plan: The patient had presented for persistent vomiting. Patient's labs are unremarkable. Patient's imaging did not reveal any acute process.  He began to complain more of muscle spasms in his right lower back which she has had before.  We gave him Valium as well as morphine.  He does not look ill or toxic, can ambulate without significant difficulty.  He will be discharged with antiemetics and muscle relaxants.   Ulice Dash, MD   Note: This note was generated in part or whole with voice recognition software. Voice recognition is usually quite accurate but there are transcription errors that can and very often do occur. I apologize for any typographical errors that were not detected and corrected.     Emily Filbert, MD 03/10/18 1020

## 2018-03-10 NOTE — ED Notes (Signed)
Patient unable to give urine sample at this time

## 2018-08-25 ENCOUNTER — Other Ambulatory Visit: Payer: Self-pay

## 2018-08-25 ENCOUNTER — Emergency Department: Payer: Self-pay

## 2018-08-25 ENCOUNTER — Encounter: Payer: Self-pay | Admitting: Emergency Medicine

## 2018-08-25 ENCOUNTER — Emergency Department
Admission: EM | Admit: 2018-08-25 | Discharge: 2018-08-25 | Disposition: A | Discharge status: Home / Self Care | Payer: Self-pay | Attending: Emergency Medicine | Admitting: Emergency Medicine

## 2018-08-25 DIAGNOSIS — F419 Anxiety disorder, unspecified: Secondary | ICD-10-CM | POA: Insufficient documentation

## 2018-08-25 LAB — CBC
HEMATOCRIT: 45.7 % (ref 39.0–52.0)
HEMOGLOBIN: 15.3 g/dL (ref 13.0–17.0)
MCH: 28.7 pg (ref 26.0–34.0)
MCHC: 33.5 g/dL (ref 30.0–36.0)
MCV: 85.7 fL (ref 80.0–100.0)
NRBC: 0 % (ref 0.0–0.2)
Platelets: 275 10*3/uL (ref 150–400)
RBC: 5.33 MIL/uL (ref 4.22–5.81)
RDW: 14.2 % (ref 11.5–15.5)
WBC: 10.5 10*3/uL (ref 4.0–10.5)

## 2018-08-25 LAB — COMPREHENSIVE METABOLIC PANEL
ALT: 33 U/L (ref 0–44)
AST: 23 U/L (ref 15–41)
Albumin: 4.1 g/dL (ref 3.5–5.0)
Alkaline Phosphatase: 81 U/L (ref 38–126)
Anion gap: 10 (ref 5–15)
BUN: 14 mg/dL (ref 6–20)
CO2: 22 mmol/L (ref 22–32)
CREATININE: 0.99 mg/dL (ref 0.61–1.24)
Calcium: 9.2 mg/dL (ref 8.9–10.3)
Chloride: 104 mmol/L (ref 98–111)
GFR calc non Af Amer: >60 mL/min (ref >60)
Glucose, Bld: 147 mg/dL — ABNORMAL HIGH (ref 70–99)
Potassium: 3.7 mmol/L (ref 3.5–5.1)
Sodium: 136 mmol/L (ref 135–145)
Total Bilirubin: 0.7 mg/dL (ref 0.3–1.2)
Total Protein: 7.5 g/dL (ref 6.5–8.1)

## 2018-08-25 LAB — TROPONIN I: Troponin I: <0.03 ng/mL (ref <0.03)

## 2018-08-25 MED ORDER — LORAZEPAM 2 MG PO TABS
2.0000 mg | ORAL_TABLET | Freq: Once | ORAL | Status: AC
  Administered 2018-08-25: 2 mg via ORAL
  Filled 2018-08-25: qty 1

## 2018-08-25 MED ORDER — LORAZEPAM 0.5 MG PO TABS: 0.5000 mg | ORAL_TABLET | Freq: Two times a day (BID) | ORAL | 0 refills | Status: DC | PRN

## 2018-08-25 NOTE — ED Notes (Signed)
Pt transported to xray at this time

## 2018-08-25 NOTE — ED Triage Notes (Signed)
Pt arrives with concerns over possible panic attack. Pt states he had a family crisis Sunday and since has felt anxious. Pt states he previously was treated for anxiety with medication but has been off medication for "2014." Pt reports chest tightness and shortness of breath intermittently since Sunday. No SI/HI
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
## 2018-09-02 ENCOUNTER — Other Ambulatory Visit: Payer: Self-pay

## 2018-09-02 ENCOUNTER — Encounter: Payer: Self-pay | Admitting: Emergency Medicine

## 2018-09-02 ENCOUNTER — Emergency Department
Admission: EM | Admit: 2018-09-02 | Discharge: 2018-09-02 | Disposition: A | Discharge status: Home / Self Care | Payer: Self-pay | Attending: Emergency Medicine | Admitting: Emergency Medicine

## 2018-09-02 DIAGNOSIS — F410 Panic disorder [episodic paroxysmal anxiety] without agoraphobia: Secondary | ICD-10-CM | POA: Insufficient documentation

## 2018-09-02 DIAGNOSIS — F428 Other obsessive-compulsive disorder: Secondary | ICD-10-CM

## 2018-09-02 DIAGNOSIS — F419 Anxiety disorder, unspecified: Secondary | ICD-10-CM | POA: Insufficient documentation

## 2018-09-02 MED ORDER — IBUPROFEN 600 MG PO TABS
600.0000 mg | ORAL_TABLET | Freq: Once | ORAL | Status: AC
  Administered 2018-09-02: 600 mg via ORAL
  Filled 2018-09-02: qty 1

## 2018-09-02 MED ORDER — PAROXETINE HCL 20 MG PO TABS
40.0000 mg | ORAL_TABLET | Freq: Every day | ORAL | Status: DC
  Filled 2018-09-02 (×2): qty 2

## 2018-09-02 MED ORDER — RISPERIDONE 1 MG PO TABS
1.0000 mg | ORAL_TABLET | Freq: Once | ORAL | Status: AC
  Administered 2018-09-02: 1 mg via ORAL
  Filled 2018-09-02: qty 1

## 2018-09-02 MED ORDER — PAROXETINE HCL 40 MG PO TABS: 40.0000 mg | ORAL_TABLET | Freq: Every day | ORAL | 0 refills | Status: DC

## 2018-09-02 MED ORDER — LORAZEPAM 1 MG PO TABS
1.0000 mg | ORAL_TABLET | Freq: Once | ORAL | Status: AC
  Administered 2018-09-02: 1 mg via ORAL
  Filled 2018-09-02: qty 1

## 2018-09-02 MED ORDER — RISPERIDONE 1 MG PO TABS: 1.0000 mg | ORAL_TABLET | Freq: Two times a day (BID) | ORAL | Status: DC

## 2018-09-02 MED ORDER — RISPERIDONE 1 MG PO TABS: ORAL_TABLET | ORAL | 1 refills | Status: DC

## 2018-09-02 NOTE — ED Notes (Signed)
Pt c/o headache 10/10 

## 2018-09-02 NOTE — ED Notes (Signed)
Patient denies pain and is resting comfortably.  

## 2018-09-02 NOTE — Consult Note (Signed)
[facility] Face-to-Face Psychiatry Consult   Reason for Consult:  anxiety Referring Physician:  Dr. Don Perking Patient Identification: Victor Logan MRN:  [id number] Principal Diagnosis: Panic attack Diagnosis:  Principal Problem:   Panic attack Active Problems:   Anxiety   Obsessional thoughts  Patient seen, chart is reviewed Total Time spent with patient: 1 hour  Subjective:  "I couldn't breathe"  HPI:   Victor Logan is a 48 y.o. male patient  with a history of anxiety who presents for evaluation of anxiety attack.  Patient was here last week for the same.  Patient reports that he has been very concerned about losing his job in the setting of COVID epidemic.  He has not been able to sleep.  He feels extremely anxious.  He was given Ativan last week which was initially helping however today did not help.  He denies chest pain or shortness of breath, fever or chills, vomiting or diarrhea.  He reports being on Xanax in 2014 for anxiety however he felt better and discontinue that medication.  He currently does not have a psychiatrist.  He denies suicidal homicidal ideation.  On evaluation, patient reports he has been having symptoms for 2 weeks, which have been worsening.  He states that at 2 pm last night not able to sleep.  He reports that he is unable to turn off his thoughts with constant worry of illness, unemployment and how he will be able to provide for his family. Patient describes his last episode of anxiety was in 2014 when he was having marital issues.  At that time he had been prescribed Xanax which he ultimately ended up being dependent on, up to 2 mg 4 times daily.  He states that over a year he was able to come off of Xanax.  He is also anxious about becoming dependent on medication again.  Patient describes depression symptoms of fluctuating appetite, poor sleep, poor concentration and energy.  He denies SI, HI, AVH. Reviewed breathing exercises for stress reduction, completed during  exam.  Patient is able to follow instructions and complete exercises on his own.  Patient reports feeling relief from deep breathing exercises.  Past Psychiatric History: Per record review patient has a history of substance abuse: Alcohol, cocaine and benzodiazepines.  In 2014 patient had suicidal ideation in the context of substance abuse.  Previous psychiatric medications: Zoloft, Prozac, Paxil, Lexapro, Cymbalta- patient reports these have bee ineffective Seroquel gave him leg pain  Risk to Self:  no Risk to Others:  no Prior Inpatient Therapy:  none; completed a 3-day detox program for alcohol when he was 48 years old per record review Prior Outpatient Therapy:   none- Has through [place] [facility]  Past Medical History: History reviewed. No pertinent past medical history.  Past Surgical History:  Procedure Laterality Date  . HERNIA REPAIR    . SHOULDER SURGERY Right 12/2014   Family History: No family history on file.   Family Psychiatric  History: He reported that his father's side of the family has history of drug use. The patient denied any history of suicide in his family.   Social History:  Social History   Substance and Sexual Activity  Alcohol Use No     Social History   Substance and Sexual Activity  Drug Use No    Social History   Socioeconomic History  . Marital status: Married    Spouse name: Not on file  . Number of children: Not on file  . Years  of education: Not on file  . Highest education level: Not on file  Occupational History  . Not on file  Social Needs  . Financial resource strain: Not on file  . Food insecurity:    Worry: Not on file    Inability: Not on file  . Transportation needs:    Medical: Not on file    Non-medical: Not on file  Tobacco Use  . Smoking status: Never Smoker  . Smokeless tobacco: Never Used  Substance and Sexual Activity  . Alcohol use: No  . Drug use: No  . Sexual activity: Not Currently  Lifestyle  . Physical  activity:    Days per week: Not on file    Minutes per session: Not on file  . Stress: Not on file  Relationships  . Social connections:    Talks on phone: Not on file    Gets together: Not on file    Attends religious service: Not on file    Active member of club or organization: Not on file    Attends meetings of clubs or organizations: Not on file    Relationship status: Not on file  Other Topics Concern  . Not on file  Social History Narrative  . 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  Insurance starts in 30 days Lives with wife for 19 years.  Wife has grown children Wife is out of work due to shoulder injury. Caroleen Hamman- 3, chihuahua- 13, and english bulldog 7 years.  Allergies:   Allergies  Allergen Reactions  . Hydrocodone Nausea And Vomiting    Labs: No results found for this or any previous visit (from the past 48 hour(s)).  No current facility-administered medications for this encounter.    Current Outpatient Medications  Medication Sig Dispense Refill  . LORazepam (ATIVAN) 0.5 MG tablet Take 1 tablet (0.5 mg total) by mouth 2 (two) times daily as needed for anxiety. 12 tablet 0    Musculoskeletal: Strength & Muscle Tone: within normal limits Gait & Station: normal Patient leans: N/A  Psychiatric Specialty Exam: Physical Exam  Nursing note and vitals reviewed. Constitutional: He is oriented to person, place, and time. He appears well-developed and well-nourished. No distress.  HENT:  Head: Normocephalic and atraumatic.  Eyes: EOM are normal.  Neck: Normal range of motion.  Cardiovascular: Normal rate and regular rhythm.  Respiratory: Effort normal. No respiratory distress.  Neurological: He is alert and oriented to person, place, and time.    Review of Systems  Constitutional: Negative.  Negative for fever.  Respiratory: Positive for shortness of breath. Negative for cough.    Cardiovascular: Positive for palpitations. Negative for chest pain.  Gastrointestinal: Positive for heartburn. Negative for diarrhea, nausea and vomiting.  Musculoskeletal: Positive for back pain.  Neurological: Positive for dizziness and headaches. Negative for weakness.  Psychiatric/Behavioral: Positive for depression. Negative for hallucinations, memory loss, substance abuse and suicidal ideas. The patient is nervous/anxious and has insomnia.     Blood pressure 128/90, pulse 76, temperature 97.9 F (36.6 C), temperature source Oral, resp. rate (!) 21, height  (1.778 m), weight 95.3 kg, SpO2 96 %.Body mass index is 30.13 kg/m.  General Appearance: Casual  Eye Contact:  Good  Speech:  Clear and Coherent  Volume:  Normal  Mood:  Anxious and Dysphoric  Affect:  Congruent  Thought Process:  Coherent, Linear and Descriptions of Associations: Intact  Orientation:  Full (Time, Place, and Person)  Thought Content:  Logical and Hallucinations: None  Suicidal Thoughts:  No  Homicidal Thoughts:  No  Memory:  good  Judgement:  Good  Insight:  Good  Psychomotor Activity:  Restlessness  Concentration:  Concentration: Fair  Recall:  Good  Fund of Knowledge:  Good  Language:  Good  Akathisia:  No  Handed:  Right  AIMS (if indicated):     Assets:  Communication Skills Desire for Improvement Intimacy Social Support Vocational/Educational  ADL's:  Intact  Cognition:  WNL  Sleep:   decreased     Treatment Plan Summary: Medication management  Start Paxil 40 mg daily for anxiety and depressed mood. Start risperidone 1/2 to 1 mg morning and midday and 2 mg at night for obsessional thoughts. Patient provided with prescriptions for 1 month while he establishes care with outpatient psychiatry and therapy.  Disposition: No evidence of imminent risk to self or others at present.   Patient does not meet criteria for psychiatric inpatient admission. Supportive therapy provided about  ongoing stressors. Discussed crisis plan, support from social network, calling 911, coming to the Emergency Department, and calling Suicide Hotline. Provided with resources for [facility] for follow-up with mental health services- prescriptions and therapy  He was able to engage in safety planning including plan to return to nearest emergency department or contact emergency services if he feels unable to maintain his own safety or the safety of others. Patient had no further questions, comments, or concerns. Discharge into care of wife, who agrees to maintain patient safety.   Mariel Craft, MD 09/02/2018 10:17 AM

## 2018-09-02 NOTE — ED Provider Notes (Signed)
[facility] Emergency Department Provider Note  ____________________________________________  Time seen: Approximately 7:37 AM  I have reviewed the triage vital signs and the nursing notes.   HISTORY  Chief Complaint Anxiety   HPI Victor Logan is a 48 y.o. male with a history of anxiety who presents for evaluation of anxiety attack.  Patient was here last week for the same.  Patient reports that he has been very concerned about losing his job in the setting of Covid epidemic.  He has not been able to sleep.  He feels extremely anxious.  He was given Ativan last week which was initially helping however today did not help.  He denies chest pain or shortness of breath, fever or chills, vomiting or diarrhea.  He reports being on Xanax in 2014 for anxiety however he felt better and discontinue that medication.  He currently does not have a psychiatrist.  He denies suicidal homicidal ideation.  PMH Anxiety  Past Surgical History:  Procedure Laterality Date  . HERNIA REPAIR    . SHOULDER SURGERY Right 12/2014    Prior to Admission medications   Medication Sig Start Date End Date Taking? Authorizing Provider  PARoxetine (PAXIL) 40 MG tablet Take 1 tablet (40 mg total) by mouth daily. 09/02/18   Mariel Craft, MD  risperiDONE (RISPERDAL) 1 MG tablet Take 1/2-1 tablet morning and mid-day and 2 tablets at night 09/02/18   Mariel Craft, MD    Allergies Hydrocodone  No family history on file.  Social History Social History   Tobacco Use  . Smoking status: Never Smoker  . Smokeless tobacco: Never Used  Substance Use Topics  . Alcohol use: No  . Drug use: No    Review of Systems  Constitutional: Negative for fever. Eyes: Negative for visual changes. ENT: Negative for sore throat. Neck: No neck pain  Cardiovascular: Negative for chest pain. Respiratory: Negative for shortness of breath. Gastrointestinal: Negative for abdominal pain, vomiting or  diarrhea. Genitourinary: Negative for dysuria. Musculoskeletal: Negative for back pain. Skin: Negative for rash. Neurological: Negative for headaches, weakness or numbness. Psych: No SI or HI. + anxiety  ____________________________________________   PHYSICAL EXAM:  VITAL SIGNS: ED Triage Vitals  Enc Vitals Group     BP 09/02/18 0628 (!) 137/102     Pulse Rate 09/02/18 0628 96     Resp 09/02/18 0628 (!) 28     Temp 09/02/18 0628 97.9 F (36.6 C)     Temp Source 09/02/18 0628 Oral     SpO2 09/02/18 0628 100 %     Weight 09/02/18 0625 210 lb 1.6 oz (95.3 kg)     Height 09/02/18 0625 5\' 10"  (1.778 m)     Head Circumference --      Peak Flow --      Pain Score 09/02/18 0625 0     Pain Loc --      Pain Edu? --      Excl. in GC? --     Constitutional: Alert and oriented, anxious appearing, hyperventilating.  HEENT:      Head: Normocephalic and atraumatic.         Eyes: Conjunctivae are normal. Sclera is non-icteric.       Mouth/Throat: Mucous membranes are moist.       Neck: Supple with no signs of meningismus. Cardiovascular: Regular rate and rhythm. No murmurs, gallops, or rubs.  Respiratory: Hyperventilating. Lungs are clear to auscultation bilaterally. No wheezes, crackles, or rhonchi.  Gastrointestinal: Soft, non tender, and non distended with positive bowel sounds. No rebound or guarding. Musculoskeletal: Nontender with normal range of motion in all extremities. No edema, cyanosis, or erythema of extremities. Neurologic: Normal speech and language. Face is symmetric. Moving all extremities. No gross focal neurologic deficits are appreciated. Skin: Skin is warm, dry and intact. No rash noted. Psychiatric: Mood and affect are depressed. Speech is pressured. Patient appears anxious  ____________________________________________   LABS (all labs ordered are listed, but only abnormal results are displayed)  Labs Reviewed - No data to display  ____________________________________________  EKG  none  ____________________________________________  RADIOLOGY  none  ____________________________________________   PROCEDURES  Procedure(s) performed:None Procedures Critical Care performed:  None ____________________________________________   INITIAL IMPRESSION / ASSESSMENT AND PLAN / ED COURSE  48 y.o. male with a history of anxiety who presents for evaluation of anxiety attack.  This is patient's second visit to the emergency room within a week.  Patient is very concerned about losing his job during the [facility].  Has not been able to sleep.  He is hyperventilating, looks anxious, but otherwise in no obvious distress.  Will give a dose of oral Ativan and consult psychiatry.    _________________________ 1:03 PM on 09/02/2018 -----------------------------------------  Patient seen by Dr. Viviano Simas, started on risperdal and paxil per Dr. Viviano Simas and cleared for dc. Discussed return precautions and follow up   As part of my medical decision making, I reviewed the following data within the electronic MEDICAL RECORD NUMBER Nursing notes reviewed and incorporated, Old chart reviewed, A consult was requested and obtained from this/these consultant(s) Psychiatry, Notes from prior ED visits and [place] Controlled Substance Database    Pertinent labs & imaging results that were available during my care of the patient were reviewed by me and considered in my medical decision making (see chart for details).    ____________________________________________   FINAL CLINICAL IMPRESSION(S) / ED DIAGNOSES  Final diagnoses:  Anxiety      NEW MEDICATIONS STARTED DURING THIS VISIT:  ED Discharge Orders         Ordered    PARoxetine (PAXIL) 40 MG tablet  Daily     09/02/18 1259    risperiDONE (RISPERDAL) 1 MG tablet     09/02/18 1259           Note:  This document was prepared using Dragon voice recognition software and may  include unintentional dictation errors.    Don Perking, [place], MD 09/02/18 1325

## 2018-09-02 NOTE — Discharge Instructions (Addendum)
You have been seen in the Emergency Department (ED)  today for a psychiatric complaint.  You have been evaluated by psychiatry and we believe you are safe to be discharged from the hospital.   ° °Please return to the Emergency Department (ED)  immediately if you have ANY thoughts of hurting yourself or anyone else, so that we may help you. ° °Please avoid alcohol and drug use. ° °Follow up with your doctor and/or therapist as soon as possible regarding today's ED  visit.  ° °You may call crisis hotline for [place] at [phone]. ° °

## 2018-09-02 NOTE — ED Notes (Addendum)
Refer to triage note: pt st he was seen here for episodes of anxiety last week. He was given a Rx for ativan 1Xweek; pt st he ran out of ativan and has not been able to follow up with MD.

## 2018-09-02 NOTE — ED Triage Notes (Signed)
Patient ambulatory to triage with steady gait, without difficulty, hyperventilating; reports since 230am having increased anxiety unrelieved by ativan; st seen last wk for same but med not helping; pt denies any pain, denies any recent illness, denies any accomp symptoms

## 2018-11-17 ENCOUNTER — Emergency Department: Payer: Self-pay

## 2018-11-17 ENCOUNTER — Other Ambulatory Visit: Payer: Self-pay

## 2018-11-17 ENCOUNTER — Encounter: Payer: Self-pay | Admitting: Emergency Medicine

## 2018-11-17 ENCOUNTER — Emergency Department
Admission: EM | Admit: 2018-11-17 | Discharge: 2018-11-17 | Disposition: A | Discharge status: Home / Self Care | Payer: Self-pay | Attending: Emergency Medicine | Admitting: Emergency Medicine

## 2018-11-17 DIAGNOSIS — Y939 Activity, unspecified: Secondary | ICD-10-CM | POA: Insufficient documentation

## 2018-11-17 DIAGNOSIS — Z79899 Other long term (current) drug therapy: Secondary | ICD-10-CM | POA: Insufficient documentation

## 2018-11-17 DIAGNOSIS — R079 Chest pain, unspecified: Secondary | ICD-10-CM

## 2018-11-17 DIAGNOSIS — Y929 Unspecified place or not applicable: Secondary | ICD-10-CM | POA: Insufficient documentation

## 2018-11-17 DIAGNOSIS — X58XXXA Exposure to other specified factors, initial encounter: Secondary | ICD-10-CM | POA: Insufficient documentation

## 2018-11-17 DIAGNOSIS — S82142A Displaced bicondylar fracture of left tibia, initial encounter for closed fracture: Secondary | ICD-10-CM | POA: Insufficient documentation

## 2018-11-17 DIAGNOSIS — Y999 Unspecified external cause status: Secondary | ICD-10-CM | POA: Insufficient documentation

## 2018-11-17 LAB — CBC WITH DIFFERENTIAL/PLATELET
Abs Immature Granulocytes: 0.03 10*3/uL (ref 0.00–0.07)
Basophils Absolute: 0.1 10*3/uL (ref 0.0–0.1)
Basophils Relative: 1 %
Eosinophils Absolute: 0.5 10*3/uL (ref 0.0–0.5)
Eosinophils Relative: 6 %
HCT: 42 % (ref 39.0–52.0)
Hemoglobin: 14 g/dL (ref 13.0–17.0)
Immature Granulocytes: 0 %
Lymphocytes Relative: 31 %
Lymphs Abs: 2.8 10*3/uL (ref 0.7–4.0)
MCH: 28.5 pg (ref 26.0–34.0)
MCHC: 33.3 g/dL (ref 30.0–36.0)
MCV: 85.5 fL (ref 80.0–100.0)
Monocytes Absolute: 1 10*3/uL (ref 0.1–1.0)
Monocytes Relative: 11 %
Neutro Abs: 4.8 10*3/uL (ref 1.7–7.7)
Neutrophils Relative %: 51 %
Platelets: 273 10*3/uL (ref 150–400)
RBC: 4.91 MIL/uL (ref 4.22–5.81)
RDW: 14 % (ref 11.5–15.5)
WBC: 9.2 10*3/uL (ref 4.0–10.5)
nRBC: 0 % (ref 0.0–0.2)

## 2018-11-17 LAB — COMPREHENSIVE METABOLIC PANEL
ALT: 23 U/L (ref 0–44)
AST: 19 U/L (ref 15–41)
Albumin: 4.3 g/dL (ref 3.5–5.0)
Alkaline Phosphatase: 92 U/L (ref 38–126)
Anion gap: 8 (ref 5–15)
BUN: 19 mg/dL (ref 6–20)
CO2: 23 mmol/L (ref 22–32)
Calcium: 9.1 mg/dL (ref 8.9–10.3)
Chloride: 107 mmol/L (ref 98–111)
Creatinine, Ser: 0.81 mg/dL (ref 0.61–1.24)
GFR calc Af Amer: >60 mL/min (ref >60)
GFR calc non Af Amer: >60 mL/min (ref >60)
Glucose, Bld: 125 mg/dL — ABNORMAL HIGH (ref 70–99)
Potassium: 4.1 mmol/L (ref 3.5–5.1)
Sodium: 138 mmol/L (ref 135–145)
Total Bilirubin: 0.3 mg/dL (ref 0.3–1.2)
Total Protein: 7.1 g/dL (ref 6.5–8.1)

## 2018-11-17 LAB — TROPONIN I: Troponin I: <0.03 ng/mL (ref <0.03)

## 2018-11-17 MED ORDER — OXYCODONE-ACETAMINOPHEN 5-325 MG PO TABS
1.0000 | ORAL_TABLET | Freq: Once | ORAL | Status: AC
  Administered 2018-11-17: 07:00:00 1 via ORAL
  Filled 2018-11-17: qty 1

## 2018-11-17 MED ORDER — OXYCODONE-ACETAMINOPHEN 10-325 MG PO TABS: 1.0000 | ORAL_TABLET | Freq: Four times a day (QID) | ORAL | 0 refills | Status: DC | PRN

## 2018-11-17 MED ORDER — OXYCODONE-ACETAMINOPHEN 5-325 MG PO TABS
1.0000 | ORAL_TABLET | Freq: Once | ORAL | Status: AC
  Administered 2018-11-17: 1 via ORAL
  Filled 2018-11-17: qty 1

## 2018-11-17 NOTE — ED Provider Notes (Signed)
[facility] Emergency Department Provider Note    First MD Initiated Contact with Patient 11/17/18 [phone]     (approximate)  I have reviewed the triage vital signs and the nursing notes.   HISTORY  Chief Complaint Back Pain and Knee Pain    HPI Victor Logan is a 48 y.o. male presents to the emergency department with 10 out of 10 nontraumatic left knee pain and swelling which patient states began yesterday.  Patient denies any calf pain no pain in the thigh.  Patient states that pain began after doing work at home.  Patient denies any fever.  Patient denies any chest pain or shortness of breath.  Patient denies any history of DVT PE or gout       History reviewed. No pertinent past medical history.  Patient Active Problem List   Diagnosis Date Noted  . Anxiety 09/02/2018  . Panic attack 09/02/2018  . Obsessional thoughts 09/02/2018    Past Surgical History:  Procedure Laterality Date  . HERNIA REPAIR    . SHOULDER SURGERY Right 12/2014    Prior to Admission medications   Medication Sig Start Date End Date Taking? Authorizing Provider  PARoxetine (PAXIL) 40 MG tablet Take 1 tablet (40 mg total) by mouth daily. 09/02/18   Mariel CraftMaurer, Sheila M, MD  risperiDONE (RISPERDAL) 1 MG tablet Take 1/2-1 tablet morning and mid-day and 2 tablets at night 09/02/18   Mariel CraftMaurer, Sheila M, MD    Allergies Hydrocodone  No family history on file.  Social History Social History   Tobacco Use  . Smoking status: Never Smoker  . Smokeless tobacco: Never Used  Substance Use Topics  . Alcohol use: No  . Drug use: No    Review of Systems Constitutional: No fever/chills Eyes: No visual changes. ENT: No sore throat. Cardiovascular: Denies chest pain. Respiratory: Denies shortness of breath. Gastrointestinal: No abdominal pain.  No nausea, no vomiting.  No diarrhea.  No constipation. Genitourinary: Negative for dysuria. Musculoskeletal: Negative for neck pain.   Negative for back pain.  Positive for left knee pain Integumentary: Negative for rash. Neurological: Negative for headaches, focal weakness or numbness.   ____________________________________________   PHYSICAL EXAM:  VITAL SIGNS: ED Triage Vitals  Enc Vitals Group     BP 11/17/18 0019 124/75     Pulse Rate 11/17/18 0019 87     Resp 11/17/18 0019 16     Temp 11/17/18 0019 97.6 F (36.4 C)     Temp Source 11/17/18 0019 Oral     SpO2 11/17/18 0019 97 %     Weight 11/17/18 0019 99.8 kg (220 lb)     Height 11/17/18 0019 1.778 m (5\' 10" )     Head Circumference --      Peak Flow --      Pain Score 11/17/18 0026 8     Pain Loc --      Pain Edu? --      Excl. in GC? --     Constitutional: Alert and oriented. Well appearing and in no acute distress. Eyes: Conjunctivae are normal.  Head: Atraumatic. Mouth/Throat: Mucous membranes are moist.  Oropharynx non-erythematous. Neck: No stridor.   Cardiovascular: Normal rate, regular rhythm. Good peripheral circulation. Grossly normal heart sounds. Respiratory: Normal respiratory effort.  No retractions. No audible wheezing. Gastrointestinal: Soft and nontender. No distention.  Musculoskeletal: Swelling noted superior lateral portion of the left knee.  Pain with very gentle palpation. Neurologic:  Normal speech and language. No  gross focal neurologic deficits are appreciated.  Skin:  Skin is warm, dry and intact. No rash noted. Psychiatric: Mood and affect are normal. Speech and behavior are normal.  ____________________________________________   LABS (all labs ordered are listed, but only abnormal results are displayed)  Labs Reviewed  COMPREHENSIVE METABOLIC PANEL - Abnormal; Notable for the following components:      Result Value   Glucose, Bld 125 (*)    All other components within normal limits  CBC WITH DIFFERENTIAL/PLATELET  TROPONIN I  ED ECG REPORT I, [place] Ina Poupard, the attending physician, personally viewed and  interpreted this ECG.   Date: 11/17/2018  EKG Time: 12:31 AM  Rate: 76  Rhythm: Normal sinus rhythm  Axis: Normal  Intervals: Normal  ST&T Change: None  _______   RADIOLOGY I, [place] Faisal Stradling, personally viewed and evaluated these images (plain radiographs) as part of my medical decision making, as well as reviewing the written report by the radiologist.  ED MD interpretation: Low lung volumes with mild bibasilar airspace opacities likely atelectasis.  Official radiology report(s): Dg Chest Port 1 View  Result Date: 11/17/2018 CLINICAL DATA:  Chest pain. Pain with deep breathing. Left knee pain and swelling beginning yesterday. EXAM: PORTABLE CHEST 1 VIEW COMPARISON:  Two-view chest x-ray 08/25/2018 FINDINGS: The heart size is normal. Lung volumes are low. Mild basilar airspace opacities are present. There is no significant edema or effusion. The visualized soft tissues and bony thorax are unremarkable. IMPRESSION: 1. Low lung volumes with mild bibasilar airspace opacities, likely atelectasis. Electronically Signed   By: San Morelle M.D.   On: 11/17/2018 05:21   Dg Knee Complete 4 Views Left  Result Date: 11/17/2018 CLINICAL DATA:  Pain EXAM: LEFT KNEE - COMPLETE 4+ VIEW COMPARISON:  None. FINDINGS: There is no definite acute displaced fracture or dislocation. There is a mild irregularity of the lateral tibial plateau. There is a small joint effusion. There are multicompartmental degenerative changes that are mild. IMPRESSION: 1. Slight irregularity of the lateral tibial plateau in association with a small joint effusion is suspicious for an underlying fracture. Consider follow-up with CT for further evaluation as clinically indicated. This may also represent an atypical appearance of degenerative changes in the lateral compartment. 2. Mild multicompartmental degenerative changes are noted. 3. No dislocation. Electronically Signed   By: Constance Holster M.D.   On: 11/17/2018 01:06      Procedures   ____________________________________________   INITIAL IMPRESSION / MDM / ASSESSMENT AND PLAN / ED COURSE  As part of my medical decision making, I reviewed the following data within the electronic MEDICAL RECORD NUMBER41 year old male presenting with above-stated history and physical exam concerning for osseous versus ligamentous injury of the knee.  X-ray revealed evidence of possible lateral tibial plateau fracture which was confirmed on CT.  Patient given Percocet in the emergency department will be prescribed the same for home.  Knee immobilizer applied to the left knee crutches given.      Victorino December was evaluated in Emergency Department on 11/17/2018 for the symptoms described in the history of present illness. He was evaluated in the context of the global COVID-19 pandemic, which necessitated consideration that the patient might be at risk for infection with the SARS-CoV-2 virus that causes COVID-19. Institutional protocols and algorithms that pertain to the evaluation of patients at risk for COVID-19 are in a state of rapid change based on information released by regulatory bodies including the CDC and federal and state organizations. These  policies and algorithms were followed during the patient's care in the ED.  Some ED evaluations and interventions may be delayed as a result of limited staffing during the pandemic.*       ____________________________________________  FINAL CLINICAL IMPRESSION(S) / ED DIAGNOSES  Final diagnoses:  Closed fracture of left tibial plateau, initial encounter     MEDICATIONS GIVEN DURING THIS VISIT:  Medications  oxyCODONE-acetaminophen (PERCOCET/ROXICET) 5-325 MG per tablet 1 tablet (1 tablet Oral Given 11/17/18 0430)     ED Discharge Orders    None       Note:  This document was prepared using Dragon voice recognition software and may include unintentional dictation errors.   Darci CurrentBrown, [place], MD 11/17/18 [phone]

## 2018-11-17 NOTE — ED Notes (Signed)
Pt  C/o of knee pain that started on Monday, pt ambulatory with a limp, pt also c/o back pain. No acute distress noted at this time.

## 2018-11-17 NOTE — ED Triage Notes (Addendum)
Patient ambulatory to triage with steady gait, without difficulty or distress noted; pt reports left knee pain/swelling since yesterday with no known injury; denies calf pain or swelling; also c/o pain just beneath left scapula that increases with deep breathing for several days; denies any SHOB or accomp symptoms

## 2019-02-22 ENCOUNTER — Emergency Department: Payer: 59

## 2019-02-22 ENCOUNTER — Emergency Department
Admission: EM | Admit: 2019-02-22 | Discharge: 2019-02-22 | Disposition: A | Discharge status: Home / Self Care | Payer: 59 | Attending: Emergency Medicine | Admitting: Emergency Medicine

## 2019-02-22 ENCOUNTER — Other Ambulatory Visit: Payer: Self-pay

## 2019-02-22 ENCOUNTER — Encounter: Payer: Self-pay | Admitting: Emergency Medicine

## 2019-02-22 DIAGNOSIS — F419 Anxiety disorder, unspecified: Secondary | ICD-10-CM | POA: Diagnosis not present

## 2019-02-22 DIAGNOSIS — M25512 Pain in left shoulder: Secondary | ICD-10-CM | POA: Diagnosis present

## 2019-02-22 MED ORDER — OXYCODONE-ACETAMINOPHEN 5-325 MG PO TABS: 1.0000 | ORAL_TABLET | Freq: Three times a day (TID) | ORAL | 0 refills | Status: DC | PRN

## 2019-02-22 MED ORDER — DIAZEPAM 5 MG PO TABS
10.0000 mg | ORAL_TABLET | Freq: Once | ORAL | Status: AC
  Administered 2019-02-22: 10 mg via ORAL
  Filled 2019-02-22: qty 2

## 2019-02-22 MED ORDER — IBUPROFEN 800 MG PO TABS
800.0000 mg | ORAL_TABLET | Freq: Once | ORAL | Status: AC
  Administered 2019-02-22: 08:00:00 800 mg via ORAL
  Filled 2019-02-22: qty 1

## 2019-02-22 MED ORDER — DIAZEPAM 5 MG PO TABS: 5.0000 mg | ORAL_TABLET | Freq: Three times a day (TID) | ORAL | 0 refills | Status: DC | PRN

## 2019-02-22 NOTE — ED Provider Notes (Signed)
[facility] Emergency Department Provider Note       Time seen: ----------------------------------------- 7:43 AM on 02/22/2019 -----------------------------------------   I have reviewed the triage vital signs and the nursing notes.  HISTORY   Chief Complaint Anxiety and Shoulder Injury    HPI Victor Logan is a 48 y.o. male with a history of anxiety, OCD, right shoulder surgery who presents to the ED for a left shoulder injury.  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  He heard a pop and has had persistent pain with decreased range of motion the left shoulder.  He also complains of anxiety.  History reviewed. No pertinent past medical history.  Patient Active Problem List   Diagnosis Date Noted  . Anxiety 09/02/2018  . Panic attack 09/02/2018  . Obsessional thoughts 09/02/2018    Past Surgical History:  Procedure Laterality Date  . HERNIA REPAIR    . SHOULDER SURGERY Right 12/2014    Allergies Hydrocodone  Social History Social History   Tobacco Use  . Smoking status: Never Smoker  . Smokeless tobacco: Never Used  Substance Use Topics  . Alcohol use: No  . Drug use: No    Review of Systems Constitutional: Negative for fever. Cardiovascular: Negative for chest pain. Respiratory: Negative for shortness of breath. Gastrointestinal: Negative for abdominal pain, vomiting and diarrhea. Musculoskeletal: Positive for left shoulder pain Skin: Negative for rash. Neurological: Negative for headaches, focal weakness or numbness. Psychiatric: Positive for anxiety  All systems negative/normal/unremarkable except as stated in the HPI  ____________________________________________   PHYSICAL EXAM:  VITAL SIGNS: ED Triage Vitals  Enc Vitals Group     BP 02/22/19 0650 (!) 146/88     Pulse Rate 02/22/19 0650 70     Resp 02/22/19 0650 18     Temp  02/22/19 0650 98.5 F (36.9 C)     Temp Source 02/22/19 0650 Oral     SpO2 02/22/19 0650 96 %     Weight 02/22/19 0651 235 lb (106.6 kg)     Height 02/22/19 0651 5\' 11"  (1.803 m)     Head Circumference --      Peak Flow --      Pain Score 02/22/19 0650 8     Pain Loc --      Pain Edu? --      Excl. in [place]? --    Constitutional: Alert and oriented. Well appearing and in no distress. Eyes: Conjunctivae are normal. Normal extraocular movements. Cardiovascular: Normal rate, regular rhythm. No murmurs, rubs, or gallops. Respiratory: Normal respiratory effort without tachypnea nor retractions. Breath sounds are clear and equal bilaterally. No wheezes/rales/rhonchi. Gastrointestinal: Soft and nontender. Normal bowel sounds Musculoskeletal: Significant limited range of motion of the left shoulder, particularly abduction and external rotation Neurologic:  Normal speech and language. No gross focal neurologic deficits are appreciated.  Skin:  Skin is warm, dry and intact. No rash noted. Psychiatric: Mildly anxious, no distress ____________________________________________  ED COURSE:  As part of my medical decision making, I reviewed the following data within the [place] History obtained from family if available, nursing notes, old chart and ekg, as well as notes from prior ED visits. Patient presented for left shoulder pain and injury as well as anxiety, we will assess with imaging as indicated at this time.   Procedures  Victorino December was evaluated in Emergency  Department on 02/22/2019 for the symptoms described in the history of present illness. He was evaluated in the context of the global COVID-19 pandemic, which necessitated consideration that the patient might be at risk for infection with the SARS-CoV-2 virus that causes COVID-19. Institutional protocols and algorithms that pertain to the evaluation of patients at risk for COVID-19 are in a state of rapid change based  on information released by regulatory bodies including the CDC and federal and state organizations. These policies and algorithms were followed during the patient's care in the ED.  ____________________________________________   RADIOLOGY Images were viewed by me  Left shoulder x-ray Does not reveal any acute process ____________________________________________   DIFFERENTIAL DIAGNOSIS   Strain, spasm, rotator cuff injury, anxiety  FINAL ASSESSMENT AND PLAN  Left shoulder pain, anxiety   Plan: The patient had presented for left shoulder pain likely secondary to rotator cuff injury as well as anxiety. Patient's imaging did not reveal any acute process.  He will be placed in a sling, given pain medicine and muscle relaxants which will also help his anxiety.  He will be referred to orthopedics for outpatient follow-up.   Ulice DashJohnathan E Brailynn Breth, MD    Note: This note was generated in part or whole with voice recognition software. Voice recognition is usually quite accurate but there are transcription errors that can and very often do occur. I apologize for any typographical errors that were not detected and corrected.     Emily FilbertWilliams, Luca Burston E, MD 02/22/19 [phone]

## 2019-02-22 NOTE — ED Triage Notes (Signed)
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

## 2019-04-08 ENCOUNTER — Other Ambulatory Visit: Payer: Self-pay | Admitting: Orthopedic Surgery

## 2019-04-15 ENCOUNTER — Other Ambulatory Visit: Payer: Self-pay

## 2019-04-15 ENCOUNTER — Encounter
Admission: RE | Admit: 2019-04-15 | Discharge: 2019-04-15 | Disposition: A | Discharge status: Home / Self Care | Payer: 59 | Source: Ambulatory Visit | Attending: Orthopedic Surgery | Admitting: Orthopedic Surgery

## 2019-04-15 DIAGNOSIS — Z01812 Encounter for preprocedural laboratory examination: Secondary | ICD-10-CM | POA: Diagnosis present

## 2019-04-15 DIAGNOSIS — Z20828 Contact with and (suspected) exposure to other viral communicable diseases: Secondary | ICD-10-CM | POA: Insufficient documentation

## 2019-04-15 HISTORY — DX: Gastro-esophageal reflux disease without esophagitis: K21.9

## 2019-04-15 LAB — BASIC METABOLIC PANEL
Anion gap: 8 (ref 5–15)
BUN: 15 mg/dL (ref 6–20)
CO2: 22 mmol/L (ref 22–32)
Calcium: 9 mg/dL (ref 8.9–10.3)
Chloride: 107 mmol/L (ref 98–111)
Creatinine, Ser: 0.86 mg/dL (ref 0.61–1.24)
GFR calc Af Amer: >60 mL/min (ref >60)
GFR calc non Af Amer: >60 mL/min (ref >60)
Glucose, Bld: 110 mg/dL — ABNORMAL HIGH (ref 70–99)
Potassium: 4.2 mmol/L (ref 3.5–5.1)
Sodium: 137 mmol/L (ref 135–145)

## 2019-04-15 LAB — CBC WITH DIFFERENTIAL/PLATELET
Abs Immature Granulocytes: 0.05 10*3/uL (ref 0.00–0.07)
Basophils Absolute: 0 10*3/uL (ref 0.0–0.1)
Basophils Relative: 1 %
Eosinophils Absolute: 0.3 10*3/uL (ref 0.0–0.5)
Eosinophils Relative: 4 %
HCT: 42.2 % (ref 39.0–52.0)
Hemoglobin: 14.2 g/dL (ref 13.0–17.0)
Immature Granulocytes: 1 %
Lymphocytes Relative: 34 %
Lymphs Abs: 2.7 10*3/uL (ref 0.7–4.0)
MCH: 28.3 pg (ref 26.0–34.0)
MCHC: 33.6 g/dL (ref 30.0–36.0)
MCV: 84.1 fL (ref 80.0–100.0)
Monocytes Absolute: 0.8 10*3/uL (ref 0.1–1.0)
Monocytes Relative: 10 %
Neutro Abs: 4 10*3/uL (ref 1.7–7.7)
Neutrophils Relative %: 50 %
Platelets: 260 10*3/uL (ref 150–400)
RBC: 5.02 MIL/uL (ref 4.22–5.81)
RDW: 14 % (ref 11.5–15.5)
WBC: 7.9 10*3/uL (ref 4.0–10.5)
nRBC: 0 % (ref 0.0–0.2)

## 2019-04-15 LAB — SARS CORONAVIRUS 2 (TAT 6-24 HRS): SARS Coronavirus 2: NEGATIVE

## 2019-04-15 LAB — PROTIME-INR
INR: 1 (ref 0.8–1.2)
Prothrombin Time: 12.6 seconds (ref 11.4–15.2)

## 2019-04-15 LAB — APTT: aPTT: 29 seconds (ref 24–36)

## 2019-04-15 NOTE — Patient Instructions (Signed)
Your procedure is scheduled on: Tuesday 04/19/19.  Report to DAY SURGERY DEPARTMENT LOCATED ON 2ND FLOOR MEDICAL MALL ENTRANCE. To find out your arrival time please call [phone] between 1PM - 3PM on Monday 04/18/19.   Remember: Instructions that are not followed completely may result in serious medical risk, up to and including death, or upon the discretion of your surgeon and anesthesiologist your surgery may need to be rescheduled.      _X__ 1. Do not eat food after midnight the night before your procedure.                 No gum chewing or hard candies. You may drink clear liquids up to 2 hours                 before you are scheduled to arrive for your surgery- DO NOT drink clear                 liquids within 2 hours of the start of your surgery.                 Clear Liquids include:  water, apple juice without pulp, clear carbohydrate                 drink such as Clearfast or Gatorade, Black Coffee or Tea (Do not add                 milk or creamer to coffee or tea).    __X__2.  On the morning of surgery brush your teeth with toothpaste and water, you may rinse your mouth with mouthwash if you wish.  Do not swallow any toothpaste or mouthwash.       _X__ 3.  No Alcohol for 24 hours before or after surgery.    __X__4.  Notify your doctor if there is any change in your medical condition      (cold, fever, infections).       Do not wear jewelry, make-up, hairpins, clips or nail polish. Do not wear lotions, powders, or perfumes.  Do not shave 48 hours prior to surgery. Men may shave face and neck. Do not bring valuables to the hospital.      [facility] is not responsible for any belongings or valuables.    Contacts, dentures/partials or body piercings may not be worn into surgery. Bring a case for your contacts, glasses or hearing aids, a denture cup will be supplied.     Patients discharged the day of surgery will not be allowed to drive home.     Please  read over the following fact sheets that you were given:   MRSA Information    __X__ Take these medicines the morning of surgery with A SIP OF WATER:     1. hydrOXYzine (ATARAX/VISTARIL) 50 MG tablet  2. omeprazole (PRILOSEC) 20 MG capsule  3. traMADol (ULTRAM) 50 MG tablet if needed      __X__ Use CHG Soap as directed    __X__ Stop Anti-inflammatories 7 days before surgery such as Advil, Ibuprofen, Motrin, BC or Goodies Powder, Naprosyn, Naproxen, Aleve, Aspirin, Meloxicam. May take Tylenol or Tramadol if needed for pain or discomfort.     __X__ Don't start taking any new herbal supplements before your procedure.

## 2019-04-19 ENCOUNTER — Ambulatory Visit: Payer: 59 | Admitting: Anesthesiology

## 2019-04-19 ENCOUNTER — Ambulatory Visit: Payer: 59

## 2019-04-19 ENCOUNTER — Encounter: Payer: Self-pay | Admitting: *Deleted

## 2019-04-19 ENCOUNTER — Ambulatory Visit
Admission: RE | Admit: 2019-04-19 | Discharge: 2019-04-19 | Disposition: A | Discharge status: Home / Self Care | Payer: 59 | Attending: Orthopedic Surgery | Admitting: Orthopedic Surgery

## 2019-04-19 ENCOUNTER — Encounter
Admission: RE | Disposition: A | Discharge status: Home / Self Care | Payer: Self-pay | Source: Home / Self Care | Attending: Orthopedic Surgery

## 2019-04-19 DIAGNOSIS — Z885 Allergy status to narcotic agent status: Secondary | ICD-10-CM | POA: Diagnosis not present

## 2019-04-19 DIAGNOSIS — M25812 Other specified joint disorders, left shoulder: Secondary | ICD-10-CM | POA: Insufficient documentation

## 2019-04-19 DIAGNOSIS — S46012A Strain of muscle(s) and tendon(s) of the rotator cuff of left shoulder, initial encounter: Secondary | ICD-10-CM | POA: Insufficient documentation

## 2019-04-19 DIAGNOSIS — K219 Gastro-esophageal reflux disease without esophagitis: Secondary | ICD-10-CM | POA: Insufficient documentation

## 2019-04-19 DIAGNOSIS — Z79891 Long term (current) use of opiate analgesic: Secondary | ICD-10-CM | POA: Diagnosis not present

## 2019-04-19 DIAGNOSIS — Z79899 Other long term (current) drug therapy: Secondary | ICD-10-CM | POA: Insufficient documentation

## 2019-04-19 DIAGNOSIS — F419 Anxiety disorder, unspecified: Secondary | ICD-10-CM | POA: Insufficient documentation

## 2019-04-19 DIAGNOSIS — Z791 Long term (current) use of non-steroidal anti-inflammatories (NSAID): Secondary | ICD-10-CM | POA: Diagnosis not present

## 2019-04-19 DIAGNOSIS — M19012 Primary osteoarthritis, left shoulder: Secondary | ICD-10-CM | POA: Insufficient documentation

## 2019-04-19 DIAGNOSIS — X500XXA Overexertion from strenuous movement or load, initial encounter: Secondary | ICD-10-CM | POA: Insufficient documentation

## 2019-04-19 HISTORY — PX: SHOULDER ARTHROSCOPY WITH OPEN ROTATOR CUFF REPAIR: SHX6092

## 2019-04-19 SURGERY — ARTHROSCOPY, SHOULDER WITH REPAIR, ROTATOR CUFF, OPEN
Anesthesia: General | Site: Shoulder | Laterality: Left

## 2019-04-19 MED ORDER — OXYCODONE HCL 5 MG PO TABS: 5.0000 mg | ORAL_TABLET | ORAL | 0 refills | Status: DC | PRN

## 2019-04-19 MED ORDER — FENTANYL CITRATE (PF) 100 MCG/2ML IJ SOLN
50.0000 ug | INTRAMUSCULAR | Status: DC | PRN
  Administered 2019-04-19: 08:00:00 50 ug via INTRAVENOUS

## 2019-04-19 MED ORDER — LACTATED RINGERS IV SOLN
INTRAVENOUS | Status: DC
  Administered 2019-04-19: 07:00:00 via INTRAVENOUS

## 2019-04-19 MED ORDER — LIDOCAINE HCL 4 % MT SOLN
OROMUCOSAL | Status: DC | PRN
  Administered 2019-04-19: 4 mL via TOPICAL

## 2019-04-19 MED ORDER — LACTATED RINGERS IV SOLN
INTRAVENOUS | Status: DC | PRN
  Administered 2019-04-19: 4 mL

## 2019-04-19 MED ORDER — LIDOCAINE HCL (PF) 2 % IJ SOLN
INTRAMUSCULAR | Status: AC
  Filled 2019-04-19: qty 10

## 2019-04-19 MED ORDER — BUPIVACAINE HCL (PF) 0.5 % IJ SOLN
INTRAMUSCULAR | Status: AC
  Filled 2019-04-19: qty 10

## 2019-04-19 MED ORDER — FENTANYL CITRATE (PF) 100 MCG/2ML IJ SOLN: 25.0000 ug | INTRAMUSCULAR | Status: DC | PRN

## 2019-04-19 MED ORDER — ONDANSETRON HCL 4 MG PO TABS: 4.0000 mg | ORAL_TABLET | Freq: Three times a day (TID) | ORAL | 0 refills | Status: DC | PRN

## 2019-04-19 MED ORDER — ROCURONIUM BROMIDE 50 MG/5ML IV SOLN
INTRAVENOUS | Status: AC
  Filled 2019-04-19: qty 1

## 2019-04-19 MED ORDER — FENTANYL CITRATE (PF) 100 MCG/2ML IJ SOLN
INTRAMUSCULAR | Status: AC
  Administered 2019-04-19: 50 ug via INTRAVENOUS
  Filled 2019-04-19: qty 2

## 2019-04-19 MED ORDER — ONDANSETRON HCL 4 MG/2ML IJ SOLN: 4.0000 mg | Freq: Once | INTRAMUSCULAR | Status: DC | PRN

## 2019-04-19 MED ORDER — SUGAMMADEX SODIUM 200 MG/2ML IV SOLN
INTRAVENOUS | Status: AC
  Filled 2019-04-19: qty 2

## 2019-04-19 MED ORDER — PROPOFOL 10 MG/ML IV BOLUS
INTRAVENOUS | Status: AC
  Filled 2019-04-19: qty 20

## 2019-04-19 MED ORDER — BUPIVACAINE HCL (PF) 0.5 % IJ SOLN
INTRAMUSCULAR | Status: DC | PRN
  Administered 2019-04-19: 10 mL

## 2019-04-19 MED ORDER — ACETAMINOPHEN 10 MG/ML IV SOLN
INTRAVENOUS | Status: DC | PRN
  Administered 2019-04-19: 1000 mg via INTRAVENOUS

## 2019-04-19 MED ORDER — PROPOFOL 10 MG/ML IV BOLUS
INTRAVENOUS | Status: DC | PRN
  Administered 2019-04-19: 180 mg via INTRAVENOUS
  Administered 2019-04-19: 20 mg via INTRAVENOUS

## 2019-04-19 MED ORDER — DEXAMETHASONE SODIUM PHOSPHATE 10 MG/ML IJ SOLN
INTRAMUSCULAR | Status: AC
  Filled 2019-04-19: qty 1

## 2019-04-19 MED ORDER — SUGAMMADEX SODIUM 200 MG/2ML IV SOLN
INTRAVENOUS | Status: DC | PRN
  Administered 2019-04-19: 200 mg via INTRAVENOUS
  Administered 2019-04-19: 50 mg via INTRAVENOUS

## 2019-04-19 MED ORDER — MIDAZOLAM HCL 2 MG/2ML IJ SOLN
INTRAMUSCULAR | Status: AC
  Administered 2019-04-19: 1 mg via INTRAVENOUS
  Filled 2019-04-19: qty 2

## 2019-04-19 MED ORDER — ONDANSETRON HCL 4 MG/2ML IJ SOLN
INTRAMUSCULAR | Status: DC | PRN
  Administered 2019-04-19: 4 mg via INTRAVENOUS

## 2019-04-19 MED ORDER — BUPIVACAINE LIPOSOME 1.3 % IJ SUSP
INTRAMUSCULAR | Status: DC | PRN
  Administered 2019-04-19: 20 mL

## 2019-04-19 MED ORDER — ACETAMINOPHEN 10 MG/ML IV SOLN
INTRAVENOUS | Status: AC
  Filled 2019-04-19: qty 100

## 2019-04-19 MED ORDER — MIDAZOLAM HCL 2 MG/2ML IJ SOLN: 0.5000 mg | INTRAMUSCULAR | Status: DC | PRN

## 2019-04-19 MED ORDER — MIDAZOLAM HCL 2 MG/2ML IJ SOLN
INTRAMUSCULAR | Status: DC | PRN
  Administered 2019-04-19: 2 mg via INTRAVENOUS

## 2019-04-19 MED ORDER — CEFAZOLIN SODIUM-DEXTROSE 2-4 GM/100ML-% IV SOLN
INTRAVENOUS | Status: AC
  Filled 2019-04-19: qty 100

## 2019-04-19 MED ORDER — LIDOCAINE HCL (PF) 1 % IJ SOLN
INTRAMUSCULAR | Status: AC
  Filled 2019-04-19: qty 5

## 2019-04-19 MED ORDER — ONDANSETRON HCL 4 MG/2ML IJ SOLN
INTRAMUSCULAR | Status: AC
  Filled 2019-04-19: qty 2

## 2019-04-19 MED ORDER — DEXAMETHASONE SODIUM PHOSPHATE 10 MG/ML IJ SOLN
INTRAMUSCULAR | Status: DC | PRN
  Administered 2019-04-19: 10 mg via INTRAVENOUS

## 2019-04-19 MED ORDER — CHLORHEXIDINE GLUCONATE CLOTH 2 % EX PADS: 6.0000 | MEDICATED_PAD | Freq: Once | CUTANEOUS | Status: DC

## 2019-04-19 MED ORDER — FENTANYL CITRATE (PF) 100 MCG/2ML IJ SOLN
INTRAMUSCULAR | Status: AC
  Filled 2019-04-19: qty 2

## 2019-04-19 MED ORDER — CEFAZOLIN SODIUM-DEXTROSE 2-4 GM/100ML-% IV SOLN
2.0000 g | INTRAVENOUS | Status: AC
  Administered 2019-04-19: 08:00:00 2 g via INTRAVENOUS

## 2019-04-19 MED ORDER — ROCURONIUM BROMIDE 100 MG/10ML IV SOLN
INTRAVENOUS | Status: DC | PRN
  Administered 2019-04-19 (×2): 50 mg via INTRAVENOUS
  Administered 2019-04-19: 20 mg via INTRAVENOUS
  Administered 2019-04-19: 30 mg via INTRAVENOUS

## 2019-04-19 MED ORDER — MIDAZOLAM HCL 2 MG/2ML IJ SOLN
INTRAMUSCULAR | Status: AC
  Filled 2019-04-19: qty 2

## 2019-04-19 MED ORDER — BUPIVACAINE LIPOSOME 1.3 % IJ SUSP
INTRAMUSCULAR | Status: AC
  Filled 2019-04-19: qty 20

## 2019-04-19 MED ORDER — CHLORHEXIDINE GLUCONATE CLOTH 2 % EX PADS
6.0000 | MEDICATED_PAD | Freq: Once | CUTANEOUS | Status: AC
  Administered 2019-04-19: 6 via TOPICAL

## 2019-04-19 MED ORDER — PHENYLEPHRINE HCL (PRESSORS) 10 MG/ML IV SOLN
INTRAVENOUS | Status: DC | PRN
  Administered 2019-04-19 (×5): 100 ug via INTRAVENOUS

## 2019-04-19 MED ORDER — FENTANYL CITRATE (PF) 100 MCG/2ML IJ SOLN
INTRAMUSCULAR | Status: DC | PRN
  Administered 2019-04-19 (×2): 50 ug via INTRAVENOUS

## 2019-04-19 MED ORDER — MIDAZOLAM HCL 2 MG/2ML IJ SOLN
1.0000 mg | INTRAMUSCULAR | Status: DC | PRN
  Administered 2019-04-19: 08:00:00 1 mg via INTRAVENOUS

## 2019-04-19 MED ORDER — LIDOCAINE HCL (CARDIAC) PF 100 MG/5ML IV SOSY
PREFILLED_SYRINGE | INTRAVENOUS | Status: DC | PRN
  Administered 2019-04-19: 100 mg via INTRAVENOUS

## 2019-04-19 SURGICAL SUPPLY — 74 items
ADAPTER IRRIG TUBE 2 SPIKE SOL (ADAPTER) ×6 IMPLANT
ANCHOR SUT 5.5 MULTIFIX (Orthopedic Implant) ×2 IMPLANT
ANCHOR SUT 5.5MM MULTIFIX (Orthopedic Implant) ×1 IMPLANT
BUR RADIUS 4.0X18.5 (BURR) ×3 IMPLANT
BUR RADIUS 5.5 (BURR) ×3 IMPLANT
CANNULA 5.75X7 CRYSTAL CLEAR (CANNULA) ×6 IMPLANT
CANNULA PARTIAL THREAD 2X7 (CANNULA) IMPLANT
CANNULA TWIST IN 8.25X9CM (CANNULA) IMPLANT
CLOSURE WOUND 1/2 X4 (GAUZE/BANDAGES/DRESSINGS) ×2
CONNECTOR PERFECT PASSER (CONNECTOR) ×3 IMPLANT
COOLER ICEMAN CLASSIC (MISCELLANEOUS) ×3 IMPLANT
COVER WAND RF STERILE (DRAPES) ×3 IMPLANT
CRADLE LAMINECT ARM (MISCELLANEOUS) ×3 IMPLANT
DEVICE SUCT BLK HOLE OR FLOOR (MISCELLANEOUS) IMPLANT
DRAPE 3/4 80X56 (DRAPES) ×3 IMPLANT
DRAPE IMP U-DRAPE 54X76 (DRAPES) ×6 IMPLANT
DRAPE INCISE IOBAN 66X45 STRL (DRAPES) ×3 IMPLANT
DRAPE U-SHAPE 47X51 STRL (DRAPES) IMPLANT
DURAPREP 26ML APPLICATOR (WOUND CARE) ×9 IMPLANT
ELECT REM PT RETURN 9FT ADLT (ELECTROSURGICAL) ×3
ELECTRODE REM PT RTRN 9FT ADLT (ELECTROSURGICAL) ×1 IMPLANT
GAUZE SPONGE 4X4 12PLY STRL (GAUZE/BANDAGES/DRESSINGS) ×6 IMPLANT
GAUZE XEROFORM 1X8 LF (GAUZE/BANDAGES/DRESSINGS) ×3 IMPLANT
GLOVE BIOGEL PI IND STRL 9 (GLOVE) ×1 IMPLANT
GLOVE BIOGEL PI INDICATOR 9 (GLOVE) ×2
GLOVE SURG 9.0 ORTHO LTXF (GLOVE) ×6 IMPLANT
GOWN STRL REUS TWL 2XL XL LVL4 (GOWN DISPOSABLE) ×3 IMPLANT
GOWN STRL REUS W/ TWL LRG LVL3 (GOWN DISPOSABLE) ×1 IMPLANT
GOWN STRL REUS W/ TWL LRG LVL4 (GOWN DISPOSABLE) ×1 IMPLANT
GOWN STRL REUS W/TWL LRG LVL3 (GOWN DISPOSABLE) ×2
GOWN STRL REUS W/TWL LRG LVL4 (GOWN DISPOSABLE) ×2
IV LACTATED RINGER IRRG 3000ML (IV SOLUTION) ×28
IV LR IRRIG 3000ML ARTHROMATIC (IV SOLUTION) ×14 IMPLANT
KIT STABILIZATION SHOULDER (MISCELLANEOUS) ×3 IMPLANT
KIT SUTURE 2.8 Q-FIX DISP (MISCELLANEOUS) IMPLANT
KIT SUTURETAK 3.0 INSERT PERC (KITS) IMPLANT
KIT TURNOVER KIT A (KITS) ×3 IMPLANT
MANIFOLD NEPTUNE II (INSTRUMENTS) ×3 IMPLANT
MASK FACE SPIDER DISP (MASK) ×3 IMPLANT
MAT ABSORB  FLUID 56X50 GRAY (MISCELLANEOUS) ×4
MAT ABSORB FLUID 56X50 GRAY (MISCELLANEOUS) ×2 IMPLANT
NDL SAFETY ECLIPSE 18X1.5 (NEEDLE) ×1 IMPLANT
NEEDLE HYPO 18GX1.5 SHARP (NEEDLE) ×2
NEEDLE HYPO 22GX1.5 SAFETY (NEEDLE) ×3 IMPLANT
NS IRRIG 500ML POUR BTL (IV SOLUTION) IMPLANT
PACK ARTHROSCOPY SHOULDER (MISCELLANEOUS) ×3 IMPLANT
PAD COLD SHLDR WRAP-ON (PAD) ×3 IMPLANT
PAD WRAPON POLAR SHDR XLG (MISCELLANEOUS) IMPLANT
PASSER SUT CAPTURE FIRST (SUTURE) IMPLANT
PASSER SUT FIRSTPASS SELF (INSTRUMENTS) ×3 IMPLANT
SET TUBE SUCT SHAVER OUTFL 24K (TUBING) ×3 IMPLANT
SET TUBE TIP INTRA-ARTICULAR (MISCELLANEOUS) ×3 IMPLANT
STRAP SAFETY 5IN WIDE (MISCELLANEOUS) ×3 IMPLANT
STRIP CLOSURE SKIN 1/2X4 (GAUZE/BANDAGES/DRESSINGS) ×4 IMPLANT
SUT ETHILON 4-0 (SUTURE) ×4
SUT ETHILON 4-0 FS2 18XMFL BLK (SUTURE) ×2
SUT LASSO 90 DEG SD STR (SUTURE) IMPLANT
SUT MNCRL 4-0 (SUTURE) ×2
SUT MNCRL 4-0 27XMFL (SUTURE) ×1
SUT PDS AB 0 CT1 27 (SUTURE) ×3 IMPLANT
SUT PERFECTPASSER WHITE CART (SUTURE) ×3 IMPLANT
SUT SMART STITCH CARTRIDGE (SUTURE) ×3 IMPLANT
SUT VIC AB 0 CT1 36 (SUTURE) ×3 IMPLANT
SUT VIC AB 2-0 CT2 27 (SUTURE) ×3 IMPLANT
SUTURE ETHLN 4-0 FS2 18XMF BLK (SUTURE) ×2 IMPLANT
SUTURE MAGNUM WIRE 2X48 BLK (SUTURE) IMPLANT
SUTURE MNCRL 4-0 27XMF (SUTURE) ×1 IMPLANT
SYR 10ML LL (SYRINGE) ×3 IMPLANT
TAPE MICROFOAM 4IN (TAPE) ×3 IMPLANT
TUBING ARTHRO INFLOW-ONLY STRL (TUBING) ×3 IMPLANT
TUBING CONNECTING 10 (TUBING) ×2 IMPLANT
TUBING CONNECTING 10' (TUBING) ×1
WAND HAND CNTRL MULTIVAC 90 (MISCELLANEOUS) ×3 IMPLANT
WRAPON POLAR PAD SHDR XLG (MISCELLANEOUS)

## 2019-04-19 NOTE — H&P (Signed)
PREOPERATIVE H&P  Chief Complaint: Left shoulder pain in the setting of partial rotator cuff tear  HPI: Victor Logan is a 48 y.o. male who presents for preoperative history and physical with a diagnosis of partial intrasubstance tear of the left infraspinatus extending into the supraspinatus, confirmed by MRI.  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  Symptoms of pain, weakness and limited range of motion are significantly impairing activities of daily living.  Patient has failed treatment with prescription strength NSAIDs, subacromial corticosteroid injection and physical therapy.  He has agreed with surgical management.   Past Medical History:  Diagnosis Date  . GERD (gastroesophageal reflux disease)    Past Surgical History:  Procedure Laterality Date  . APPENDECTOMY    . HERNIA REPAIR    . SHOULDER SURGERY Right 12/2014   Social History   Socioeconomic History  . Marital status: Married    Spouse name: Not on file  . Number of children: Not on file  . Years of education: Not on file  . Highest education level: Not on file  Occupational History  . Not on file  Social Needs  . Financial resource strain: Not on file  . Food insecurity    Worry: Not on file    Inability: Not on file  . Transportation needs    Medical: Not on file    Non-medical: Not on file  Tobacco Use  . Smoking status: Never Smoker  . Smokeless tobacco: Never Used  Substance and Sexual Activity  . Alcohol use: Yes    Comment: Occasionally  . Drug use: No  . Sexual activity: Not Currently  Lifestyle  . Physical activity    Days per week: Not on file    Minutes per session: Not on file  . Stress: Not on file  Relationships  . Social Herbalist on phone: Not on file    Gets together: Not on file    Attends religious service: Not on file    Active member of club or organization: Not on file    Attends meetings of clubs or organizations: Not  on file    Relationship status: Not on file  Other Topics Concern  . Not on file  Social History Narrative  . Not on file   History reviewed. No pertinent family history. Allergies  Allergen Reactions  . Hydrocodone Nausea And Vomiting and Rash   Prior to Admission medications   Medication Sig Start Date End Date Taking? Authorizing Provider  hydrOXYzine (ATARAX/VISTARIL) 50 MG tablet Take 50 mg by mouth 2 (two) times daily. 03/30/19  Yes [provider]  meloxicam (MOBIC) 7.5 MG tablet Take 7.5 mg by mouth 2 (two) times daily. 04/07/19  Yes [provider]  omeprazole (PRILOSEC) 20 MG capsule Take 20 mg by mouth daily.   Yes [provider]  traMADol (ULTRAM) 50 MG tablet Take 100 mg by mouth every 6 (six) hours.  04/06/19  Yes [provider]  diazepam (VALIUM) 5 MG tablet Take 1 tablet (5 mg total) by mouth every 8 (eight) hours as needed for muscle spasms. Patient not taking: Reported on 04/12/2019 02/22/19   Earleen Newport, MD  oxyCODONE-acetaminophen (PERCOCET) 10-325 MG tablet Take 1 tablet by mouth every 6 (six) hours as needed for pain. Patient not taking: Reported on 04/15/2019 11/17/18 11/17/19  Gregor Hams, MD  oxyCODONE-acetaminophen (PERCOCET) 5-325 MG tablet Take 1 tablet by  mouth every 8 (eight) hours as needed. Patient not taking: Reported on 04/12/2019 02/22/19   Emily Filbert, MD  PARoxetine (PAXIL) 40 MG tablet Take 1 tablet (40 mg total) by mouth daily. Patient not taking: Reported on 04/12/2019 09/02/18   Mariel Craft, MD  risperiDONE (RISPERDAL) 1 MG tablet Take 1/2-1 tablet morning and mid-day and 2 tablets at night Patient not taking: Reported on 04/12/2019 09/02/18   Mariel Craft, MD     Positive ROS: All other systems have been reviewed and were otherwise negative with the exception of those mentioned in the HPI and as above.  Physical Exam: General: Alert, no acute distress Cardiovascular: Regular rate  and rhythm, no murmurs rubs or gallops.  No pedal edema Respiratory: Clear to auscultation bilaterally, no wheezes rales or rhonchi. No cyanosis, no use of accessory musculature GI: No organomegaly, abdomen is soft and non-tender nondistended with positive bowel sounds. Skin: Skin intact, no lesions within the operative field. Neurologic: Sensation intact distally Psychiatric: Patient is competent for consent with normal mood and affect Lymphatic: No cervical lymphadenopathy  MUSCULOSKELETAL: Left shoulder: Patient skin is intact.  There is no erythema ecchymosis swelling or deformity.  Patient has pain with active forward elevation and abduction beyond 90 degrees.  He has pain with a downward directed force on his abducted shoulder.  He has full digital wrist and elbow range of motion, intact sensation light touch and a palpable radial pulse.  He has positive impingement signs but no apprehension or instability.  Assessment: M75.102 unspecified rotator cuff tear or rupture of left shoulder  Plan: Plan for Procedure(s): LEFT SHOULDER ARTHROSCOPIC SUBACROMIAL DECOMPRESSION, DISTAL CLAVICLE EXCISION WITH POSSIBLE MINI-OPEN ROTATOR CUFF REPAIR  I reviewed the details of the operation as well as the postoperative course with the patient.  I discussed the risks and benefits of surgery. The risks include but are not limited to infection, bleeding, nerve or blood vessel injury, joint stiffness or loss of motion, persistent pain, weakness or instability, retear of the rotator cuff, Popeye deformity if a biceps tenotomy is required, and hardware failure and the need for further surgery. Medical risks include but are not limited to DVT and pulmonary embolism, myocardial infarction, stroke, pneumonia, respiratory failure and death. Patient understood these risks and wished to proceed.     Juanell Fairly, MD   04/19/2019 7:33 AM

## 2019-04-19 NOTE — Anesthesia Preprocedure Evaluation (Signed)
Anesthesia Evaluation  Patient identified by MRN, date of birth, ID band Patient awake    Reviewed: Allergy & Precautions, NPO status , Patient's Chart, lab work & pertinent test results  Airway Mallampati: II  TM Distance: >3 FB     Dental   Pulmonary neg pulmonary ROS,    Pulmonary exam normal        Cardiovascular negative cardio ROS Normal cardiovascular exam     Neuro/Psych Anxiety negative neurological ROS  negative psych ROS   GI/Hepatic Neg liver ROS, GERD  ,  Endo/Other  negative endocrine ROS  Renal/GU negative Renal ROS  negative genitourinary   Musculoskeletal negative musculoskeletal ROS (+)   Abdominal Normal abdominal exam  (+)   Peds negative pediatric ROS (+)  Hematology negative hematology ROS (+)   Anesthesia Other Findings   Reproductive/Obstetrics                             Anesthesia Physical Anesthesia Plan  ASA: II  Anesthesia Plan: General   Post-op Pain Management:    Induction:   PONV Risk Score and Plan:   Airway Management Planned: Oral ETT  Additional Equipment:   Intra-op Plan:   Post-operative Plan: Extubation in OR  Informed Consent: I have reviewed the patients History and Physical, chart, labs and discussed the procedure including the risks, benefits and alternatives for the proposed anesthesia with the patient or authorized representative who has indicated his/her understanding and acceptance.     Dental advisory given  Plan Discussed with: CRNA and Surgeon  Anesthesia Plan Comments:         Anesthesia Quick Evaluation

## 2019-04-19 NOTE — Anesthesia Procedure Notes (Signed)
Anesthesia Regional Block: Interscalene brachial plexus block   Pre-Anesthetic Checklist: ,, timeout performed, Correct Patient, Correct Site, Correct Laterality, Correct Procedure, Correct Position, site marked, Risks and benefits discussed,  Surgical consent,  Pre-op evaluation,  At surgeon's request and post-op pain management  Laterality: Left  Prep: chloraprep, alcohol swabs       Needles:  Injection technique: Single-shot  Needle Type: Stimiplex     Needle Length: 5cm  Needle Gauge: 22     Additional Needles:   Procedures:, nerve stimulator,,, ultrasound used (permanent image in chart),,,,   Nerve Stimulator or Paresthesia:  Response: biceps flexion, 0.5 mA,   Additional Responses:   Narrative:  Start time: 04/19/2019 7:36 AM End time: 04/19/2019 7:50 AM Injection made incrementally with aspirations every 5 mL.  Performed by: Personally  Anesthesiologist: Alvin Critchley, MD  Additional Notes: Time out called.  Patient placed in a semisitting position with a roll under the left scapula.  A scout film was made for the interscalene block of the target area and a line was drawn lateral to the probe.  The area was wiped with alcohol and a skin wheal was made along the line with 1% Lidocaine plain.  The left neck was prepped with chloroprep and a sterile sheathed US probe was used to ID the nerves in a typical stoplight fashion.  A 22G stimuplex needle was advanced to the site lateral to the nerve bundle and a twitch was obtained down to 0.75mAmps before fade.  Incremental injection of a combination of 20 cc of exparel and 10 cc of plain 0.5% Marcaine was used with easy injection.  Good floww around the nerves.  The patient tolerated the procedure well with no pain on injection.

## 2019-04-19 NOTE — Discharge Instructions (Addendum)
AMBULATORY SURGERY  DISCHARGE INSTRUCTIONS   1) The drugs that you were given will stay in your system until tomorrow so for the next 24 hours you should not:  A) Drive an automobile B) Make any legal decisions C) Drink any alcoholic beverage   2) You may resume regular meals tomorrow.  Today it is better to start with liquids and gradually work up to solid foods.  You may eat anything you prefer, but it is better to start with liquids, then soup and crackers, and gradually work up to solid foods.   3) Please notify your doctor immediately if you have any unusual bleeding, trouble breathing, redness and pain at the surgery site, drainage, fever, or pain not relieved by medication.    4) Additional Instructions:   Please contact your physician with any problems or Same Day Surgery at [phone], Monday through Friday 6 am to 4 pm, or [place] [facility] [place] Main number at [phone].     Interscalene Nerve Block with Exparel  1.  For your surgery you have received an Interscalene Nerve Block with Exparel. 2. Nerve Blocks affect many types of nerves, including nerves that control movement, pain and normal sensation.  You may experience feelings such as numbness, tingling, heaviness, weakness or the inability to move your arm or the feeling or sensation that your arm has "fallen asleep". 3. A nerve block with Exparel can last up to 5 days.  Usually the weakness wears off first.  The tingling and heaviness usually wear off next.  Finally you may start to notice pain.  Keep in mind that this may occur in any order.  Once a nerve block starts to wear off it is usually completely gone within 60 minutes. 4. ISNB may cause mild shortness of breath, a hoarse voice, blurry vision, unequal pupils, or drooping of the face on the same side as the nerve block.  These symptoms will usually resolve with the numbness.  Very rarely the procedure itself can cause mild seizures. 5. If needed, your  surgeon will give you a prescription for pain medication.  It will take about 60 minutes for the oral pain medication to become fully effective.  So, it is recommended that you start taking this medication before the nerve block first begins to wear off, or when you first begin to feel discomfort. 6. Take your pain medication only as prescribed.  Pain medication can cause sedation and decrease your breathing if you take more than you need for the level of pain that you have. 7. Nausea is a common side effect of many pain medications.  You may want to eat something before taking your pain medicine to prevent nausea. 8. After an Interscalene nerve block, you cannot feel pain, pressure or extremes in temperature in the effected arm.  Because your arm is numb it is at an increased risk for injury.  To decrease the possibility of injury, please practice the following:  a. While you are awake change the position of your arm frequently to prevent too much pressure on any one area for prolonged periods of time. b.  If you have a cast or tight dressing, check the color or your fingers every couple of hours.  Call your surgeon with the appearance of any discoloration (white or blue). c. If you are given a sling to wear before you go home, please wear it  at all times until the block has completely worn off.  Do not get up at   night without your sling. d. Please contact [facility] or your surgeon if you do not begin to regain sensation after 7 days from the surgery.  Anesthesia may be contacted by calling the Same Day Surgery Department, Mon. through Fri., 6 am to 4 pm at [phone].   e. If you experience any other problems or concerns, please contact your surgeon's office. f. If you experience severe or prolonged shortness of breath go to the nearest emergency department. 

## 2019-04-19 NOTE — Op Note (Signed)
04/19/2019  10:34 AM  PATIENT:  Victor Logan  48 y.o. male  PRE-OPERATIVE DIAGNOSIS:  M75.102 unspecified rotator cuff tear or rupture of left shoulder, subacromial impingement and acromioclavicular arthrosis  POST-OPERATIVE DIAGNOSIS:  PARTIAL ROTATOR CUFF TEAR WITH SUBACROMIAL IMPINGEMENT AND ACROMIOCLAVICULAR ARTHROSIS  PROCEDURE:  Procedure(s): LEFT SHOULDER ARTHROSCOPIC SUBACROMIAL DECOMPRESSION, DISTAL CLAVICLE EXCISION, ARTHROSCOPIC  ROTATOR CUFF REPAIR   SURGEON:  Surgeon(s) and Role:    Thornton Park, MD - Primary  ANESTHESIA:   general and paracervical block   PREOPERATIVE INDICATIONS:  Victor Logan is a  48 y.o. male with a diagnosis of M75.102 unspecified rotator cuff tear or rupture of left shoulder failed conservative treatment and elected for surgical management.    The risks benefits and alternatives were discussed with the patient preoperatively including but not limited to the risks of infection, bleeding, nerve injury, persistent pain or weakness, failure of the hardware, re-tear of the rotator cuff and the need for further surgery. Medical risks include DVT and pulmonary embolism, myocardial infarction, stroke, pneumonia, respiratory failure and death. Patient understood these risks and wished to proceed.  OPERATIVE IMPLANTS: ArthroCare multifix anchor x 1  OPERATIVE FINDINGS: Partial thickness (interstitial) tear of the infra spinatus extending into the posterior aspect of the supraspinatus.  Subacromial impingement with extensive bursitis and acromioclavicular joint arthrosis.  Patient's biceps tendon was intact.  There is no evidence of a labral tear no focal chondral injuries.  Patient had fraying of the superior aspect of the subscapularis without detachment from the lesser tuberosity.  OPERATIVE PROCEDURE: The patient was met in the preoperative area. The left shoulder was signed with the word yes and my initials according the hospital's correct site of  surgery protocol.   History and physical was updated.   Patient underwent an interscalene block with Exparel by the anesthesia service in the preoperative area.  Patient was brought to the operating room where he underwent general endotracheal intubation.  The patient was placed in a beachchair position.  A spider arm positioner was used for this case. Examination under anesthesia revealed no limitation of motion or instability with load shift testing. The patient had a negative sulcus sign.  Patient was prepped and draped in a sterile fashion. A timeout was performed to verify the patient's name, date of birth, medical record number, correct site of surgery and correct procedure to be performed there was also used to verify the patient received antibiotics that all appropriate instruments, implants and radiographs studies were available in the room. Once all in attendance were in agreement case began.  Bony landmarks were drawn out with a surgical marker along with proposed arthroscopy incisions. These were pre-injected with 1% lidocaine plain. An 11 blade was used to establish a posterior portal through which the arthroscope was placed in the glenohumeral joint. A full diagnostic examination of the shoulder was performed.  Patient was found to have a partial thickness tear of the infraspinatus extending into the supraspinatus. An 18-gauge spinal needle was used to place a 0 PDS suture through the rotator cuff tear for identification from the bursal side.  The arthroscope was then placed in the subacromial space.  Extensive bursitis was encountered and debrided using a 4-0 resector shaver blade and a 90 ArthroCare wand from a lateral portal which was established under direct visualization using an 18-gauge spinal needle.  The 0 PDS suture was identified.  A probe was used to examine the bursal side of the rotator cuff.  The probe  penetrated into the interstitial tear.  A 4.0 mm resector shaver blade was used  to open the bursal side of the infraspinatus exposing the interstitial tear.  The greater tuberosity footprint at the interstitial tear was debrided until punctate bleeding was identified.  Two #2 sutures were passed through the torn rotator cuff using a first pass.  A subacromial decompression was also performed using a 5.5 mm resector shaver blade from the lateral portal.  The 5.5 mm resector shaver blade was then placed to the anterior portal and a distal clavicle excision was performed.   The attention was then turned to the rotator cuff repair.  A T-handle punch was used to create a hole in the proximal humerus just lateral to the greater tuberosity.  The #2 sutures placed in the lateral aspect of the rotator cuff tear were then loaded into a [employer] Multifix anchor.  This anchor was inserted into the hole created by the T-handle punch.  It was malleted into position.  The sutures were then tensioned to allow for anatomic reduction of the rotator cuff to the greater tuberosity footprint.  The multi fix anchor was then screwed into position by hand.  The ends of the suture tails were cut using an arthroscopic suture cutter.  Final images of the repair were taken.  All arthroscopic instruments were then removed.  Skin closure for the arthroscopic incisions was performed with 4-0 nylon. A dry sterile dressing was applied.  The patient was placed in an abduction sling that he brought with him from home.  All sharp, sponge and instrument counts were correct at the conclusion of the case. I was scrubbed and present for the entire case. I spoke with the patient's wife postoperatively to let her know the case had been performed without complication and the patient was stable in recovery room.

## 2019-04-19 NOTE — Anesthesia Procedure Notes (Signed)
Procedure Name: Intubation Date/Time: 04/19/2019 8:05 AM Performed by: Eben Burow, CRNA Pre-anesthesia Checklist: Patient identified, Emergency Drugs available, Suction available and Patient being monitored Patient Re-evaluated:Patient Re-evaluated prior to induction Oxygen Delivery Method: Circle system utilized Preoxygenation: Pre-oxygenation with 100% oxygen Induction Type: IV induction Ventilation: Oral airway inserted - appropriate to patient size and Two handed mask ventilation required Laryngoscope Size: McGraph and 4 Grade View: Grade I Tube type: Oral Tube size: 8.0 mm Number of attempts: 1 Airway Equipment and Method: Stylet,  Oral airway,  Video-laryngoscopy and LTA kit utilized Placement Confirmation: positive ETCO2 and breath sounds checked- equal and bilateral Secured at: 23 cm Tube secured with: Tape Dental Injury: Teeth and Oropharynx as per pre-operative assessment

## 2019-04-19 NOTE — Transfer of Care (Signed)
Immediate Anesthesia Transfer of Care Note  Patient: Victor Logan  Procedure(s) Performed: SHOULDER ARTHROSCOPIC SUBACROMIAL DECOMPRESSION, DISTAL CLAVICLE EXCISION, ARTHROSCOPIC  ROTATOR CUFF REPAIR (Left Shoulder)  Patient Location: PACU  Anesthesia Type:GA combined with regional for post-op pain  Level of Consciousness: drowsy  Airway & Oxygen Therapy: Patient Spontanous Breathing and Patient connected to face mask oxygen  Post-op Assessment: Report given to RN and Post -op Vital signs reviewed and stable  Post vital signs: Reviewed and stable  Last Vitals:  Vitals Value Taken Time  BP 104/77 04/19/19 1029  Temp    Pulse 63 04/19/19 1031  Resp 17 04/19/19 1031  SpO2 95 % 04/19/19 1031  Vitals shown include unvalidated device data.  Last Pain:  Vitals:   04/19/19 0617  TempSrc: Tympanic  PainSc: 6          Complications: No apparent anesthesia complications

## 2019-04-19 NOTE — Anesthesia Post-op Follow-up Note (Signed)
Anesthesia QCDR form completed.        

## 2019-04-20 ENCOUNTER — Encounter: Payer: Self-pay | Admitting: Orthopedic Surgery

## 2019-04-20 NOTE — Anesthesia Postprocedure Evaluation (Signed)
Anesthesia Post Note  Patient: DAYMOND CORDTS  Procedure(s) Performed: SHOULDER ARTHROSCOPIC SUBACROMIAL DECOMPRESSION, DISTAL CLAVICLE EXCISION, ARTHROSCOPIC  ROTATOR CUFF REPAIR (Left Shoulder)  Patient location during evaluation: PACU Anesthesia Type: General Level of consciousness: awake and alert and oriented Pain management: pain level controlled Vital Signs Assessment: post-procedure vital signs reviewed and stable Respiratory status: spontaneous breathing Cardiovascular status: blood pressure returned to baseline Anesthetic complications: no     Last Vitals:  Vitals:   04/19/19 1126 04/19/19 1141  BP: 133/85 128/84  Pulse: 67 75  Resp: 18 18  Temp: (!) 36.2 C   SpO2: 96% 97%    Last Pain:  Vitals:   04/20/19 1035  TempSrc:   PainSc: 7                  Maricela Schreur

## 2022-11-14 DIAGNOSIS — R079 Chest pain, unspecified: Secondary | ICD-10-CM | POA: Diagnosis not present

## 2022-11-14 DIAGNOSIS — Z79899 Other long term (current) drug therapy: Secondary | ICD-10-CM | POA: Diagnosis not present

## 2022-11-14 DIAGNOSIS — R1011 Right upper quadrant pain: Secondary | ICD-10-CM | POA: Diagnosis not present

## 2022-11-14 DIAGNOSIS — R918 Other nonspecific abnormal finding of lung field: Secondary | ICD-10-CM | POA: Diagnosis not present

## 2022-11-14 DIAGNOSIS — K219 Gastro-esophageal reflux disease without esophagitis: Secondary | ICD-10-CM | POA: Diagnosis not present

## 2022-11-14 DIAGNOSIS — R109 Unspecified abdominal pain: Secondary | ICD-10-CM | POA: Diagnosis not present

## 2022-11-14 DIAGNOSIS — R0789 Other chest pain: Secondary | ICD-10-CM | POA: Diagnosis not present

## 2022-11-26 ENCOUNTER — Other Ambulatory Visit: Payer: Self-pay

## 2022-11-26 DIAGNOSIS — Z021 Encounter for pre-employment examination: Secondary | ICD-10-CM

## 2022-11-26 NOTE — Progress Notes (Signed)
Completed and cleared pre employment urine drug screen for [employer].  

## 2023-02-08 DIAGNOSIS — K4090 Unilateral inguinal hernia, without obstruction or gangrene, not specified as recurrent: Secondary | ICD-10-CM | POA: Diagnosis not present

## 2023-02-08 DIAGNOSIS — N451 Epididymitis: Secondary | ICD-10-CM | POA: Diagnosis not present

## 2023-02-08 DIAGNOSIS — R1030 Lower abdominal pain, unspecified: Secondary | ICD-10-CM | POA: Diagnosis not present

## 2023-02-08 DIAGNOSIS — N50819 Testicular pain, unspecified: Secondary | ICD-10-CM | POA: Diagnosis not present

## 2023-02-08 DIAGNOSIS — N433 Hydrocele, unspecified: Secondary | ICD-10-CM | POA: Diagnosis not present

## 2023-02-08 DIAGNOSIS — S76211A Strain of adductor muscle, fascia and tendon of right thigh, initial encounter: Secondary | ICD-10-CM | POA: Diagnosis not present

## 2023-02-08 DIAGNOSIS — R1031 Right lower quadrant pain: Secondary | ICD-10-CM | POA: Diagnosis not present

## 2023-02-08 DIAGNOSIS — Z79899 Other long term (current) drug therapy: Secondary | ICD-10-CM | POA: Diagnosis not present

## 2023-02-08 DIAGNOSIS — K219 Gastro-esophageal reflux disease without esophagitis: Secondary | ICD-10-CM | POA: Diagnosis not present

## 2023-02-08 DIAGNOSIS — X58XXXA Exposure to other specified factors, initial encounter: Secondary | ICD-10-CM | POA: Diagnosis not present

## 2023-02-08 DIAGNOSIS — S76811A Strain of other specified muscles, fascia and tendons at thigh level, right thigh, initial encounter: Secondary | ICD-10-CM | POA: Diagnosis not present

## 2023-02-26 DIAGNOSIS — M7712 Lateral epicondylitis, left elbow: Secondary | ICD-10-CM | POA: Diagnosis not present

## 2023-03-23 ENCOUNTER — Other Ambulatory Visit: Payer: Self-pay

## 2023-03-23 ENCOUNTER — Emergency Department: Payer: 59

## 2023-03-23 ENCOUNTER — Emergency Department
Admission: EM | Admit: 2023-03-23 | Discharge: 2023-03-23 | Disposition: A | Discharge status: Home / Self Care | Payer: 59 | Attending: Emergency Medicine | Admitting: Emergency Medicine

## 2023-03-23 DIAGNOSIS — R1032 Left lower quadrant pain: Secondary | ICD-10-CM | POA: Diagnosis not present

## 2023-03-23 DIAGNOSIS — R519 Headache, unspecified: Secondary | ICD-10-CM | POA: Insufficient documentation

## 2023-03-23 DIAGNOSIS — K5730 Diverticulosis of large intestine without perforation or abscess without bleeding: Secondary | ICD-10-CM | POA: Diagnosis not present

## 2023-03-23 DIAGNOSIS — K4090 Unilateral inguinal hernia, without obstruction or gangrene, not specified as recurrent: Secondary | ICD-10-CM | POA: Diagnosis not present

## 2023-03-23 LAB — COMPREHENSIVE METABOLIC PANEL
ALT: 27 U/L (ref 0–44)
AST: 18 U/L (ref 15–41)
Albumin: 3.7 g/dL (ref 3.5–5.0)
Alkaline Phosphatase: 94 U/L (ref 38–126)
Anion gap: 9 (ref 5–15)
BUN: 22 mg/dL — ABNORMAL HIGH (ref 6–20)
CO2: 21 mmol/L — ABNORMAL LOW (ref 22–32)
Calcium: 8.3 mg/dL — ABNORMAL LOW (ref 8.9–10.3)
Chloride: 108 mmol/L (ref 98–111)
Creatinine, Ser: 0.94 mg/dL (ref 0.61–1.24)
GFR, Estimated: >60 mL/min (ref >60)
Glucose, Bld: 137 mg/dL — ABNORMAL HIGH (ref 70–99)
Potassium: 4.1 mmol/L (ref 3.5–5.1)
Sodium: 138 mmol/L (ref 135–145)
Total Bilirubin: 0.2 mg/dL — ABNORMAL LOW (ref 0.3–1.2)
Total Protein: 6.7 g/dL (ref 6.5–8.1)

## 2023-03-23 LAB — URINALYSIS, ROUTINE W REFLEX MICROSCOPIC
Bacteria, UA: NONE SEEN
Bilirubin Urine: NEGATIVE
Glucose, UA: NEGATIVE mg/dL
Hgb urine dipstick: NEGATIVE
Ketones, ur: NEGATIVE mg/dL
Leukocytes,Ua: NEGATIVE
Nitrite: NEGATIVE
Protein, ur: NEGATIVE mg/dL
Specific Gravity, Urine: 1.025 (ref 1.005–1.030)
pH: 5 (ref 5.0–8.0)

## 2023-03-23 LAB — CBC
HCT: 42 % (ref 39.0–52.0)
Hemoglobin: 13.8 g/dL (ref 13.0–17.0)
MCH: 28.6 pg (ref 26.0–34.0)
MCHC: 32.9 g/dL (ref 30.0–36.0)
MCV: 87 fL (ref 80.0–100.0)
Platelets: 247 10*3/uL (ref 150–400)
RBC: 4.83 MIL/uL (ref 4.22–5.81)
RDW: 14.6 % (ref 11.5–15.5)
WBC: 7.6 10*3/uL (ref 4.0–10.5)
nRBC: 0 % (ref 0.0–0.2)

## 2023-03-23 LAB — LIPASE, BLOOD: Lipase: 43 U/L (ref 11–51)

## 2023-03-23 MED ORDER — PROCHLORPERAZINE MALEATE 10 MG PO TABS: 10.0000 mg | ORAL_TABLET | Freq: Four times a day (QID) | ORAL | 0 refills | Status: DC | PRN

## 2023-03-23 MED ORDER — IOHEXOL 300 MG/ML  SOLN
100.0000 mL | Freq: Once | INTRAMUSCULAR | Status: AC | PRN
  Administered 2023-03-23: 100 mL via INTRAVENOUS

## 2023-03-23 MED ORDER — HALOPERIDOL LACTATE 5 MG/ML IJ SOLN
5.0000 mg | Freq: Once | INTRAMUSCULAR | Status: AC
  Administered 2023-03-23: 5 mg via INTRAVENOUS
  Filled 2023-03-23: qty 1

## 2023-03-23 MED ORDER — DICYCLOMINE HCL 10 MG PO CAPS: 10.0000 mg | ORAL_CAPSULE | Freq: Three times a day (TID) | ORAL | 0 refills | Status: DC | PRN

## 2023-03-23 NOTE — Discharge Instructions (Signed)
Please seek medical attention for any high fevers, chest pain, shortness of breath, change in behavior, persistent vomiting, bloody stool or any other new or concerning symptoms.

## 2023-03-23 NOTE — ED Provider Notes (Signed)
[facility] Provider Note    Event Date/Time   First MD Initiated Contact with Patient 03/23/23 [phone]     (approximate)   History   Abdominal pain  HPI  Victor Logan is a 52 y.o. male  who presents to the emergency department today because of concerns for abdominal pain.  Patient has secondary concerns of headache.  The pain is located in his left lower quadrant.  The patient states that he has been having issues for roughly 1 month.  The headache will come and go.  He had a similar pain although in the right lower quadrant about a month ago and was seen at outside ED with negative imaging.  He denies any difficulty with urination or bowel movements. Denies any fevers.      Physical Exam   Triage Vital Signs: ED Triage Vitals  Encounter Vitals Group     BP 03/23/23 0833 (!) 153/91     Systolic BP Percentile --      Diastolic BP Percentile --      Pulse Rate 03/23/23 0833 68     Resp 03/23/23 0833 18     Temp 03/23/23 0833 98 F (36.7 C)     Temp src --      SpO2 03/23/23 0833 98 %     Weight --      Height --      Head Circumference --      Peak Flow --      Pain Score 03/23/23 0832 8     Pain Loc --      Pain Education --      Exclude from Growth Chart --     Most recent vital signs: Vitals:   03/23/23 0833  BP: (!) 153/91  Pulse: 68  Resp: 18  Temp: 98 F (36.7 C)  SpO2: 98%   General: Awake, alert, oriented. CV:  Good peripheral perfusion. Regular rate and rhythm. Resp:  Normal effort. Lungs clear. Abd:  No distention. Slightly tender in left lower quadrant.  ED Results / Procedures / Treatments   Labs (all labs ordered are listed, but only abnormal results are displayed) Labs Reviewed  COMPREHENSIVE METABOLIC PANEL - Abnormal; Notable for the following components:      Result Value   CO2 21 (*)    Glucose, Bld 137 (*)    BUN 22 (*)    Calcium 8.3 (*)    Total Bilirubin 0.2 (*)    All other components within normal  limits  URINALYSIS, ROUTINE W REFLEX MICROSCOPIC - Abnormal; Notable for the following components:   Color, Urine YELLOW (*)    APPearance CLEAR (*)    All other components within normal limits  LIPASE, BLOOD  CBC     EKG  None   RADIOLOGY I independently interpreted and visualized the CT abd/pel. My interpretation: No diverticulitis Radiology interpretation:  IMPRESSION:  1. No acute localizing findings in the abdomen or pelvis.  2. Hepatic steatosis.  3. Colonic diverticulosis without evidence of acute diverticulitis.  4.  Aortic Atherosclerosis (ICD10-I70.0).   I independently interpreted and visualized the CT head. My interpretation: No bleed Radiology interpretation:  IMPRESSION:  1. Normal for age noncontrast CT appearance of the brain.  2. Left forehead scalp soft tissue scarring suspected.       PROCEDURES:  Critical Care performed: No    MEDICATIONS ORDERED IN ED: Medications - No data to display   IMPRESSION / MDM / ASSESSMENT  AND PLAN / ED COURSE  I reviewed the triage vital signs and the nursing notes.                              Differential diagnosis includes, but is not limited to, diverticulitis, gastroenteritis, ICH, intracranial mass, migraine  Patient's presentation is most consistent with acute presentation with potential threat to life or bodily function.  Patient presented to the emergency department today because of concerns for abdominal pain as well as headache.  Patient did have some tenderness in the left side of the abdomen so CT was obtained.  This did not show any concerning inflammation.  Additionally CT of the head was ordered which did not show any concerning findings.  Blood work without concerning leukocytosis.  Patient was given medication to try to help with both headache and abdominal pain.  He did feel improved.  At this time given reassuring workup I think is reasonable for patient be discharged home.     FINAL CLINICAL  IMPRESSION(S) / ED DIAGNOSES   Final diagnoses:  Bad headache  Left lower quadrant abdominal pain      Note:  This document was prepared using Dragon voice recognition software and may include unintentional dictation errors.    Phineas Semen, MD 03/24/23 [phone]
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
## 2023-04-28 DIAGNOSIS — Z79899 Other long term (current) drug therapy: Secondary | ICD-10-CM | POA: Diagnosis not present

## 2023-04-28 DIAGNOSIS — M25511 Pain in right shoulder: Secondary | ICD-10-CM | POA: Diagnosis not present

## 2023-04-28 DIAGNOSIS — M25522 Pain in left elbow: Secondary | ICD-10-CM | POA: Diagnosis not present

## 2023-04-28 DIAGNOSIS — M778 Other enthesopathies, not elsewhere classified: Secondary | ICD-10-CM | POA: Diagnosis not present

## 2023-04-28 DIAGNOSIS — M542 Cervicalgia: Secondary | ICD-10-CM | POA: Diagnosis not present

## 2023-04-28 DIAGNOSIS — Z885 Allergy status to narcotic agent status: Secondary | ICD-10-CM | POA: Diagnosis not present

## 2023-04-28 DIAGNOSIS — K219 Gastro-esophageal reflux disease without esophagitis: Secondary | ICD-10-CM | POA: Diagnosis not present

## 2023-04-29 ENCOUNTER — Ambulatory Visit: Payer: Self-pay | Admitting: Physician Assistant

## 2023-04-29 ENCOUNTER — Encounter: Payer: Self-pay | Admitting: Physician Assistant

## 2023-04-29 VITALS — BP 178/98 | HR 89 | Temp 97.3°F | Resp 16 | Ht 70.0 in | Wt 240.0 lb

## 2023-04-29 DIAGNOSIS — M25511 Pain in right shoulder: Secondary | ICD-10-CM

## 2023-04-29 NOTE — Progress Notes (Signed)
B/P checked twice and manual.  Stated takes no meds.  Pain level right shoulder level 6 at this time and left elbow level zero at this time.  Work injury 04/27/23 reported with [facility] ED visit on 04/28/23 stating R shoulder/neck pain.  Arrived today ambulating without difficulty with right arm in a sling.  Stated had a work note at ED, but doesn't have note with him.  Stated ortho eval sent from ED.  Stated is patient at [employer], but not under treatment now.  Stated about 10 years ago reports tear in right shoulder and has prior surgery on right shoulder in past.  Discussed injury protocol and to report and follow this injury through [employer] [facility] for direction through [facility] workers comp claim to be initiated.
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
## 2023-05-04 ENCOUNTER — Ambulatory Visit: Payer: Self-pay | Admitting: Physician Assistant

## 2023-05-04 ENCOUNTER — Encounter: Payer: Self-pay | Admitting: Physician Assistant

## 2023-05-04 VITALS — BP 153/98 | HR 93 | Temp 97.8°F | Resp 16

## 2023-05-04 DIAGNOSIS — S46911D Strain of unspecified muscle, fascia and tendon at shoulder and upper arm level, right arm, subsequent encounter: Secondary | ICD-10-CM

## 2023-05-04 MED ORDER — ORPHENADRINE CITRATE ER 100 MG PO TB12: 100.0000 mg | ORAL_TABLET | Freq: Two times a day (BID) | ORAL | 0 refills | Status: AC

## 2023-05-04 MED ORDER — NAPROXEN 500 MG PO TABS: 500.0000 mg | ORAL_TABLET | Freq: Two times a day (BID) | ORAL | Status: AC

## 2023-05-04 NOTE — Progress Notes (Signed)
Follow up workers comp eval of right shoulder and left elbow.  Arrived wearing right arm sling pointing to right neck and upper shoulder stated pain is level 6-7 at this time to that area and left elbow pain level 4 at this time.  Ambulates without difficulty.
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
## 2023-05-11 ENCOUNTER — Ambulatory Visit: Payer: Self-pay | Admitting: Physician Assistant

## 2023-05-11 ENCOUNTER — Encounter: Payer: Self-pay | Admitting: Physician Assistant

## 2023-05-11 VITALS — BP 168/96 | HR 86 | Temp 98.0°F | Resp 16

## 2023-05-11 DIAGNOSIS — M25511 Pain in right shoulder: Secondary | ICD-10-CM
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
## 2023-05-11 NOTE — Progress Notes (Signed)
Follow up workers comp injury DOI 04/27/23 to right arm/shoulder.  Stated level 7 pain at this time pointing to right shoulder/neck area and reports decreased ROM right arm.

## 2023-05-25 ENCOUNTER — Other Ambulatory Visit: Payer: 59 | Admitting: Physician Assistant

## 2023-06-05 ENCOUNTER — Emergency Department
Admission: EM | Admit: 2023-06-05 | Discharge: 2023-06-05 | Disposition: A | Discharge status: Home / Self Care | Payer: MEDICAID | Attending: Emergency Medicine | Admitting: Emergency Medicine

## 2023-06-05 ENCOUNTER — Emergency Department: Payer: MEDICAID

## 2023-06-05 ENCOUNTER — Other Ambulatory Visit: Payer: Self-pay

## 2023-06-05 DIAGNOSIS — N12 Tubulo-interstitial nephritis, not specified as acute or chronic: Secondary | ICD-10-CM | POA: Insufficient documentation

## 2023-06-05 DIAGNOSIS — N5082 Scrotal pain: Secondary | ICD-10-CM

## 2023-06-05 DIAGNOSIS — K4090 Unilateral inguinal hernia, without obstruction or gangrene, not specified as recurrent: Secondary | ICD-10-CM | POA: Diagnosis not present

## 2023-06-05 DIAGNOSIS — R109 Unspecified abdominal pain: Secondary | ICD-10-CM | POA: Diagnosis not present

## 2023-06-05 DIAGNOSIS — N5089 Other specified disorders of the male genital organs: Secondary | ICD-10-CM

## 2023-06-05 LAB — CBC
HCT: 43.1 % (ref 39.0–52.0)
Hemoglobin: 14.4 g/dL (ref 13.0–17.0)
MCH: 28.8 pg (ref 26.0–34.0)
MCHC: 33.4 g/dL (ref 30.0–36.0)
MCV: 86.2 fL (ref 80.0–100.0)
Platelets: 281 10*3/uL (ref 150–400)
RBC: 5 MIL/uL (ref 4.22–5.81)
RDW: 13.3 % (ref 11.5–15.5)
WBC: 14.3 10*3/uL — ABNORMAL HIGH (ref 4.0–10.5)
nRBC: 0 % (ref 0.0–0.2)

## 2023-06-05 LAB — URINALYSIS, ROUTINE W REFLEX MICROSCOPIC
Bilirubin Urine: NEGATIVE
Glucose, UA: NEGATIVE mg/dL
Ketones, ur: NEGATIVE mg/dL
Nitrite: POSITIVE — AB
Protein, ur: 100 mg/dL — AB
RBC / HPF: >50 RBC/hpf (ref 0–5)
Specific Gravity, Urine: 1.015 (ref 1.005–1.030)
WBC, UA: >50 WBC/hpf (ref 0–5)
pH: 5 (ref 5.0–8.0)

## 2023-06-05 LAB — BASIC METABOLIC PANEL
Anion gap: 9 (ref 5–15)
BUN: 19 mg/dL (ref 6–20)
CO2: 21 mmol/L — ABNORMAL LOW (ref 22–32)
Calcium: 9 mg/dL (ref 8.9–10.3)
Chloride: 105 mmol/L (ref 98–111)
Creatinine, Ser: 0.99 mg/dL (ref 0.61–1.24)
GFR, Estimated: >60 mL/min (ref >60)
Glucose, Bld: 137 mg/dL — ABNORMAL HIGH (ref 70–99)
Potassium: 4.1 mmol/L (ref 3.5–5.1)
Sodium: 135 mmol/L (ref 135–145)

## 2023-06-05 MED ORDER — SODIUM CHLORIDE 0.9 % IV SOLN
2.0000 g | Freq: Once | INTRAVENOUS | Status: AC
  Administered 2023-06-05: 2 g via INTRAVENOUS
  Filled 2023-06-05: qty 20

## 2023-06-05 MED ORDER — KETOROLAC TROMETHAMINE 15 MG/ML IJ SOLN
15.0000 mg | Freq: Once | INTRAMUSCULAR | Status: AC
  Administered 2023-06-05: 15 mg via INTRAVENOUS
  Filled 2023-06-05: qty 1

## 2023-06-05 MED ORDER — OXYCODONE-ACETAMINOPHEN 5-325 MG PO TABS
1.0000 | ORAL_TABLET | Freq: Once | ORAL | Status: AC
  Administered 2023-06-05: 1 via ORAL
  Filled 2023-06-05: qty 1

## 2023-06-05 MED ORDER — CEFPODOXIME PROXETIL 200 MG PO TABS: 200.0000 mg | ORAL_TABLET | Freq: Two times a day (BID) | ORAL | 0 refills | Status: AC

## 2023-06-05 NOTE — Discharge Instructions (Addendum)
Please return if you notice significant worsening of symptoms or unable to keep multiple doses of the antibiotics down due to vomiting.  CT imaging also had an incidental finding of a small cyst on the bottom of your left kidney.  This appears to be the same size as been seen in the past.  There is nothing further to do with this at this time aside from monitoring it outpatient.

## 2023-06-05 NOTE — ED Provider Notes (Signed)
[facility] ([place]) Provider Note    Event Date/Time   First MD Initiated Contact with Patient 06/05/23 1203     (approximate)   History   Flank Pain   HPI ARISON BEECROFT is a 52 y.o. male presenting today for right flank pain.  Over the past 24 hours patient has had right-sided flank pain which radiates to his right lower quadrant and into his groin.  Has had some difficulty with urinating and notes of pressure.  Otherwise denies fever, nausea, vomiting, other abdominal pain, constipation, diarrhea, hematuria, testicular swelling, testicular redness.  No prior history of kidney stones.  Prior surgical history includes hernia repairs and appendectomy.     Physical Exam   Triage Vital Signs: ED Triage Vitals  Encounter Vitals Group     BP 06/05/23 0957 (!) 132/106     Systolic BP Percentile --      Diastolic BP Percentile --      Pulse Rate 06/05/23 0957 71     Resp 06/05/23 0957 18     Temp 06/05/23 0957 98.4 F (36.9 C)     Temp Source 06/05/23 0957 Oral     SpO2 06/05/23 0957 96 %     Weight 06/05/23 1210 240 lb 1.3 oz (108.9 kg)     Height 06/05/23 1210 5\' 10"  (1.778 m)     Head Circumference --      Peak Flow --      Pain Score 06/05/23 0955 (S) 10     Pain Loc --      Pain Education --      Exclude from Growth Chart --     Most recent vital signs: Vitals:   06/05/23 0957  BP: (!) 132/106  Pulse: 71  Resp: 18  Temp: 98.4 F (36.9 C)  SpO2: 96%   Physical Exam: I have reviewed the vital signs and nursing notes. General: Awake, alert, no acute distress.  Nontoxic appearing. Head:  Atraumatic, normocephalic.   ENT:  EOM intact, PERRL. Oral mucosa is pink and moist with no lesions. Neck: Neck is supple with full range of motion, No meningeal signs. Cardiovascular:  RRR, No murmurs. Peripheral pulses palpable and equal bilaterally. Respiratory:  Symmetrical chest wall expansion.  No rhonchi, rales, or wheezes.  Good air movement throughout.   No use of accessory muscles.   Musculoskeletal:  No cyanosis or edema. Moving extremities with full ROM Abdomen:  Soft, generalized soreness to right side of abdomen and right flank without obvious CVA tenderness to the outside, nondistended. Neuro:  GCS 15, moving all four extremities, interacting appropriately. Speech clear. Psych:  Calm, appropriate.   Skin:  Warm, dry, no rash.    ED Results / Procedures / Treatments   Labs (all labs ordered are listed, but only abnormal results are displayed) Labs Reviewed  URINALYSIS, ROUTINE W REFLEX MICROSCOPIC - Abnormal; Notable for the following components:      Result Value   Color, Urine YELLOW (*)    APPearance CLOUDY (*)    Hgb urine dipstick LARGE (*)    Protein, ur 100 (*)    Nitrite POSITIVE (*)    Leukocytes,Ua LARGE (*)    Bacteria, UA MANY (*)    All other components within normal limits  BASIC METABOLIC PANEL - Abnormal; Notable for the following components:   CO2 21 (*)    Glucose, Bld 137 (*)    All other components within normal limits  CBC - Abnormal; Notable for  the following components:   WBC 14.3 (*)    All other components within normal limits  URINE CULTURE     EKG    RADIOLOGY Independently interpreted CT imaging and ultrasound with no acute pathology   PROCEDURES:  Critical Care performed: No  Procedures   MEDICATIONS ORDERED IN ED: Medications  cefTRIAXone (ROCEPHIN) 2 g in sodium chloride 0.9 % 100 mL IVPB (has no administration in time range)  ketorolac (TORADOL) 15 MG/ML injection 15 mg (has no administration in time range)  oxyCODONE-acetaminophen (PERCOCET/ROXICET) 5-325 MG per tablet 1 tablet (1 tablet Oral Given 06/05/23 1011)     IMPRESSION / MDM / ASSESSMENT AND PLAN / ED COURSE  I reviewed the triage vital signs and the nursing notes.                              Differential diagnosis includes, but is not limited to, UTI, testicular torsion, pyelonephritis, nephrolithiasis,  cholecystitis, hernia  Patient's presentation is most consistent with acute complicated illness / injury requiring diagnostic workup.  Patient is a 52 year old male presenting today for right-sided flank pain.  Vital signs stable on arrival and physical exam largely unremarkable aside from just generalized soreness to the right side of his abdomen.  No peritonitic features.  Laboratory workup with mild leukocytosis of 14.3.  UA grossly positive for UTI.  BMP largely unremarkable.  Scrotal ultrasound negative.  CT imaging of the abdomen/pelvis unremarkable for acute pathology.  Given soreness to the right side of his abdomen, suspect UTI may be progressing into pyelonephritis.  Patient is otherwise stable and do think he is suitable for one-time dose of ceftriaxone here and discharged with outpatient antibiotics.  Patient is agreeable with this plan and given strict return precautions should symptoms worsen.     FINAL CLINICAL IMPRESSION(S) / ED DIAGNOSES   Final diagnoses:  Pyelonephritis     Rx / DC Orders   ED Discharge Orders          Ordered    cefpodoxime (VANTIN) 200 MG tablet  2 times daily        06/05/23 1229             Note:  This document was prepared using Dragon voice recognition software and may include unintentional dictation errors.   Janith Lima, MD 06/05/23 1229

## 2023-06-05 NOTE — ED Triage Notes (Signed)
Pt c/o right side back that comes around to the front down to his groin area. Denies any n/v.

## 2023-06-05 NOTE — ED Provider Triage Note (Signed)
Emergency Medicine Provider Triage Evaluation Note  Victor Logan , a 52 y.o. male  was evaluated in triage.  Pt complains of flank pain, thinks right side of scrotum is swollen.  Review of Systems  Positive:  Negative:   Physical Exam  BP (!) 132/106 (BP Location: Left Arm)   Pulse 71   Temp 98.4 F (36.9 C) (Oral)   Resp 18   SpO2 96%  Gen:   Awake, no distress   Resp:  Normal effort  MSK:   Moves extremities without difficulty  Other:    Medical Decision Making  Medically screening exam initiated at 10:09 AM.  Appropriate orders placed.  [name] was informed that the remainder of the evaluation will be completed by another provider, this initial triage assessment does not replace that evaluation, and the importance of remaining in the ED until their evaluation is complete.     Faythe Ghee, PA-C 06/05/23 1010

## 2023-06-07 LAB — URINE CULTURE: Culture: >=100000 — AB
